# Patient Record
Sex: Female | Born: 1980 | Race: White | Hispanic: No | Marital: Married | Smoking: Never smoker
Health system: Southern US, Community
[De-identification: ages and names within clinical notes are randomized; demographics above are authoritative.]

## PROBLEM LIST (undated history)

## (undated) ENCOUNTER — Inpatient Hospital Stay: Payer: Self-pay

## (undated) DIAGNOSIS — Z973 Presence of spectacles and contact lenses: Secondary | ICD-10-CM

## (undated) DIAGNOSIS — R51 Headache: Secondary | ICD-10-CM

## (undated) DIAGNOSIS — R519 Headache, unspecified: Secondary | ICD-10-CM

## (undated) HISTORY — PX: COSMETIC SURGERY: SHX468

---

## 2014-09-22 ENCOUNTER — Ambulatory Visit
Admit: 2014-09-22 | Disposition: A | Discharge status: Home / Self Care | Payer: Self-pay | Attending: General Practice | Admitting: General Practice

## 2014-10-16 DIAGNOSIS — R519 Headache, unspecified: Secondary | ICD-10-CM | POA: Insufficient documentation

## 2014-10-16 DIAGNOSIS — R51 Headache: Secondary | ICD-10-CM

## 2014-12-27 ENCOUNTER — Telehealth: Payer: Self-pay | Admitting: Gastroenterology

## 2014-12-27 NOTE — Telephone Encounter (Signed)
Colonoscopy triage °

## 2014-12-28 ENCOUNTER — Other Ambulatory Visit: Payer: Self-pay

## 2014-12-28 NOTE — Telephone Encounter (Signed)
Gastroenterology Pre-Procedure Review  Request Date: 02-12-15 Requesting Physician: Dr.   PATIENT REVIEW QUESTIONS: The patient responded to the following health history questions as indicated:    1. Are you having any GI issues? no 2. Do you have a personal history of Polyps? no 3. Do you have a family history of Colon Cancer or Polyps? yes (Sister. Recently dx with colon cancer) 4. Diabetes Mellitus? no 5. Joint replacements in the past 12 months?no 6. Major health problems in the past 3 months?no 7. Any artificial heart valves, MVP, or defibrillator?no    MEDICATIONS & ALLERGIES:    Patient reports the following regarding taking any anticoagulation/antiplatelet therapy:   Plavix, Coumadin, Eliquis, Xarelto, Lovenox, Pradaxa, Brilinta, or Effient? no Aspirin? no  Patient confirms/reports the following medications:  Current Outpatient Prescriptions  Medication Sig Dispense Refill  . valACYclovir (VALTREX) 1000 MG tablet Take 1,000 mg by mouth 2 (two) times daily.    . nortriptyline (PAMELOR) 50 MG capsule TAKE 1 CAPSULE BY MOUTH NIGHTLY.  0   No current facility-administered medications for this visit.    Patient confirms/reports the following allergies:  No Known Allergies  No orders of the defined types were placed in this encounter.    AUTHORIZATION INFORMATION Primary Insurance: 1D#: Group #:  Secondary Insurance: 1D#: Group #:  SCHEDULE INFORMATION: Date: 02-12-15 Time: Location: [facility]

## 2015-02-05 ENCOUNTER — Encounter: Payer: Self-pay | Admitting: *Deleted

## 2015-02-09 NOTE — Discharge Instructions (Signed)

## 2015-02-12 ENCOUNTER — Ambulatory Visit: Payer: PRIVATE HEALTH INSURANCE | Admitting: Anesthesiology

## 2015-02-12 ENCOUNTER — Encounter
Admission: RE | Disposition: A | Discharge status: Home / Self Care | Payer: Self-pay | Source: Ambulatory Visit | Attending: Gastroenterology

## 2015-02-12 ENCOUNTER — Ambulatory Visit
Admission: RE | Admit: 2015-02-12 | Discharge: 2015-02-12 | Disposition: A | Discharge status: Home / Self Care | Payer: PRIVATE HEALTH INSURANCE | Source: Ambulatory Visit | Attending: Gastroenterology | Admitting: Gastroenterology

## 2015-02-12 DIAGNOSIS — Z1211 Encounter for screening for malignant neoplasm of colon: Secondary | ICD-10-CM | POA: Insufficient documentation

## 2015-02-12 DIAGNOSIS — Z79899 Other long term (current) drug therapy: Secondary | ICD-10-CM | POA: Diagnosis not present

## 2015-02-12 DIAGNOSIS — Z800 Family history of malignant neoplasm of digestive organs: Secondary | ICD-10-CM | POA: Diagnosis not present

## 2015-02-12 DIAGNOSIS — Z6838 Body mass index (BMI) 38.0-38.9, adult: Secondary | ICD-10-CM | POA: Insufficient documentation

## 2015-02-12 HISTORY — DX: Headache: R51

## 2015-02-12 HISTORY — PX: COLONOSCOPY WITH PROPOFOL: SHX5780

## 2015-02-12 HISTORY — DX: Presence of spectacles and contact lenses: Z97.3

## 2015-02-12 HISTORY — DX: Headache, unspecified: R51.9

## 2015-02-12 SURGERY — COLONOSCOPY WITH PROPOFOL
Anesthesia: Monitor Anesthesia Care | Wound class: Contaminated

## 2015-02-12 MED ORDER — PROPOFOL 10 MG/ML IV BOLUS
INTRAVENOUS | Status: DC | PRN
  Administered 2015-02-12: 40 mg via INTRAVENOUS
  Administered 2015-02-12: 60 mg via INTRAVENOUS
  Administered 2015-02-12: 40 mg via INTRAVENOUS
  Administered 2015-02-12: 30 mg via INTRAVENOUS
  Administered 2015-02-12: 40 mg via INTRAVENOUS
  Administered 2015-02-12 (×2): 20 mg via INTRAVENOUS

## 2015-02-12 MED ORDER — LACTATED RINGERS IV SOLN
INTRAVENOUS | Status: DC
  Administered 2015-02-12: 10:00:00 via INTRAVENOUS

## 2015-02-12 MED ORDER — STERILE WATER FOR IRRIGATION IR SOLN
Status: DC | PRN
  Administered 2015-02-12: 11:00:00

## 2015-02-12 MED ORDER — LIDOCAINE HCL (CARDIAC) 20 MG/ML IV SOLN
INTRAVENOUS | Status: DC | PRN
  Administered 2015-02-12: 50 mg via INTRAVENOUS

## 2015-02-12 SURGICAL SUPPLY — 28 items

## 2015-02-12 NOTE — Anesthesia Procedure Notes (Signed)
Procedure Name: MAC Performed by: Miyoko Hashimi Pre-anesthesia Checklist: Patient identified, Emergency Drugs available, Suction available, Timeout performed and Patient being monitored Patient Re-evaluated:Patient Re-evaluated prior to inductionOxygen Delivery Method: Nasal cannula Placement Confirmation: positive ETCO2       

## 2015-02-12 NOTE — Anesthesia Preprocedure Evaluation (Signed)
Anesthesia Evaluation  Patient identified by MRN, date of birth, ID band  Reviewed: Allergy & Precautions, H&P , NPO status , Patient's Chart, lab work & pertinent test results  Airway Mallampati: II  TM Distance: >3 FB Neck ROM: full    Dental no notable dental hx.    Pulmonary    Pulmonary exam normal       Cardiovascular Rhythm:regular Rate:Normal     Neuro/Psych  Headaches,    GI/Hepatic   Endo/Other  Morbid obesity  Renal/GU      Musculoskeletal   Abdominal   Peds  Hematology   Anesthesia Other Findings   Reproductive/Obstetrics                             Anesthesia Physical Anesthesia Plan  ASA: II  Anesthesia Plan: MAC   Post-op Pain Management:    Induction:   Airway Management Planned:   Additional Equipment:   Intra-op Plan:   Post-operative Plan:   Informed Consent: I have reviewed the patients History and Physical, chart, labs and discussed the procedure including the risks, benefits and alternatives for the proposed anesthesia with the patient or authorized representative who has indicated his/her understanding and acceptance.     Plan Discussed with: CRNA  Anesthesia Plan Comments:         Anesthesia Quick Evaluation

## 2015-02-12 NOTE — Op Note (Signed)
[facility] Gastroenterology Patient Name: Anna Gaines Procedure Date: 02/12/2015 10:57 AM MRN: [id number] Account #: [id number] Date of Birth: 08/01/1980 Admit Type: Outpatient Age: 34 Room: [facility] OR ROOM 01 Gender: Female Note Status: Finalized Procedure:         Colonoscopy Indications:       Screening for colon cancer: Colorectal cancer in distant                     relative(s) before age 19 Providers:         Midge Minium, MD Referring MD:      Jabier Mutton, MD (Referring MD) Medicines:         Propofol per Anesthesia Complications:     No immediate complications. Procedure:         Pre-Anesthesia Assessment:                    - Prior to the procedure, a History and Physical was                     performed, and patient medications and allergies were                     reviewed. The patient's tolerance of previous anesthesia                     was also reviewed. The risks and benefits of the procedure                     and the sedation options and risks were discussed with the                     patient. All questions were answered, and informed consent                     was obtained. Prior Anticoagulants: The patient has taken                     no previous anticoagulant or antiplatelet agents. ASA                     Grade Assessment: II - A patient with mild systemic                     disease. After reviewing the risks and benefits, the                     patient was deemed in satisfactory condition to undergo                     the procedure.                    After obtaining informed consent, the colonoscope was                     passed under direct vision. Throughout the procedure, the                     patient's blood pressure, pulse, and oxygen saturations                     were monitored continuously. The Olympus CF H180AL  colonoscope (S#: [id number]) was introduced through the anus                     and  advanced to the the cecum, identified by appendiceal                     orifice and ileocecal valve. The colonoscopy was performed                     without difficulty. The patient tolerated the procedure                     well. The quality of the bowel preparation was excellent. Findings:      The perianal and digital rectal examinations were normal.      The colon (entire examined portion) appeared normal. Impression:        - The entire examined colon is normal.                    - No specimens collected. Recommendation:    - Repeat colonoscopy in 5 years for surveillance. Procedure Code(s): --- Professional ---                    [phone], Colonoscopy, flexible; diagnostic, including                     collection of specimen(s) by brushing or washing, when                     performed (separate procedure) Diagnosis Code(s): --- Professional ---                    Z12.11, Encounter for screening for malignant neoplasm of                     colon                    Z80.0, Family history of malignant neoplasm of digestive                     organs CPT copyright 2014 American Medical Association. All rights reserved. The codes documented in this report are preliminary and upon coder review may  be revised to meet current compliance requirements. Midge Minium, MD 02/12/2015 11:23:53 AM This report has been signed electronically. Number of Addenda: 0 Note Initiated On: 02/12/2015 10:57 AM Scope Withdrawal Time: 0 hours 7 minutes 31 seconds  Total Procedure Duration: 0 hours 12 minutes 48 seconds       [facility]

## 2015-02-12 NOTE — Anesthesia Postprocedure Evaluation (Signed)
  Anesthesia Post-op Note  Patient: Anna Gaines  Procedure(s) Performed: Procedure(s): COLONOSCOPY WITH PROPOFOL (N/A)  Anesthesia type:MAC  Patient location: PACU  Post pain: Pain level controlled  Post assessment: Post-op Vital signs reviewed, Patient's Cardiovascular Status Stable, Respiratory Function Stable, Patent Airway and No signs of Nausea or vomiting  Post vital signs: Reviewed and stable  Last Vitals:  Filed Vitals:   02/12/15 1135  BP:   Pulse: 82  Temp:   Resp: 20    Level of consciousness: awake, alert  and patient cooperative  Complications: No apparent anesthesia complications
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
## 2015-02-12 NOTE — Transfer of Care (Signed)
Immediate Anesthesia Transfer of Care Note  Patient: Anna Gaines  Procedure(s) Performed: Procedure(s): COLONOSCOPY WITH PROPOFOL (N/A)  Patient Location: PACU  Anesthesia Type: MAC  Level of Consciousness: awake, alert  and patient cooperative  Airway and Oxygen Therapy: Patient Spontanous Breathing and Patient connected to supplemental oxygen  Post-op Assessment: Post-op Vital signs reviewed, Patient's Cardiovascular Status Stable, Respiratory Function Stable, Patent Airway and No signs of Nausea or vomiting  Post-op Vital Signs: Reviewed and stable  Complications: No apparent anesthesia complications

## 2015-02-13 ENCOUNTER — Encounter: Payer: Self-pay | Admitting: Gastroenterology

## 2015-04-02 ENCOUNTER — Ambulatory Visit: Payer: Self-pay | Admitting: Physician Assistant

## 2015-04-02 ENCOUNTER — Other Ambulatory Visit: Payer: Self-pay | Admitting: Physician Assistant

## 2015-04-02 VITALS — BP 120/80 | HR 116 | Temp 98.5°F

## 2015-04-02 DIAGNOSIS — J0120 Acute ethmoidal sinusitis, unspecified: Secondary | ICD-10-CM

## 2015-04-02 DIAGNOSIS — H1089 Other conjunctivitis: Secondary | ICD-10-CM

## 2015-04-02 MED ORDER — GENTAMICIN SULFATE 0.3 % OP SOLN: 1.0000 [drp] | Freq: Four times a day (QID) | OPHTHALMIC | Status: DC

## 2015-04-02 MED ORDER — NAPHAZOLINE HCL 0.1 % OP SOLN: 1.0000 [drp] | Freq: Four times a day (QID) | OPHTHALMIC | Status: DC | PRN

## 2015-04-02 NOTE — Progress Notes (Signed)
  Red Eyes Subjective:    Patient ID: Anna Gaines, female    DOB: 02/20/1981, 34 y.o.   MRN: [id number]  HPI Patient c/o redness, wateryeyes, and matted eyelid for 2 days.  Patient wear contact lens. Last usage was 3 days ago.  Denies vision loss. Wearing eyeglass at this time.    Review of Systems Denies URI compliant.     Objective:   Physical Exam PERLA/EOM intact.  Right erythematous conjunctiva, matted left eye lid.       Assessment & Plan:  Conjunctivitis. Gentex and Naphcon eye drops as directed.  Follow up it condition worsen.

## 2015-04-19 DIAGNOSIS — G44221 Chronic tension-type headache, intractable: Secondary | ICD-10-CM | POA: Insufficient documentation

## 2015-05-25 ENCOUNTER — Ambulatory Visit: Payer: Self-pay | Admitting: Physician Assistant

## 2015-05-25 ENCOUNTER — Encounter: Payer: Self-pay | Admitting: Physician Assistant

## 2015-05-25 VITALS — BP 119/70 | HR 95 | Temp 98.6°F

## 2015-05-25 DIAGNOSIS — J029 Acute pharyngitis, unspecified: Secondary | ICD-10-CM

## 2015-05-25 DIAGNOSIS — B001 Herpesviral vesicular dermatitis: Secondary | ICD-10-CM

## 2015-05-25 MED ORDER — VALACYCLOVIR HCL 1 G PO TABS: 1000.0000 mg | ORAL_TABLET | Freq: Two times a day (BID) | ORAL | Status: DC

## 2015-05-25 MED ORDER — AZITHROMYCIN 250 MG PO TABS: ORAL_TABLET | ORAL | Status: DC

## 2015-05-25 MED ORDER — FLUCONAZOLE 150 MG PO TABS: 150.0000 mg | ORAL_TABLET | Freq: Once | ORAL | Status: DC

## 2015-05-25 NOTE — Progress Notes (Signed)
S:  C/o sore throat , sx for 1 day, no exposure to strep, no fever/chills, no cp/sob, no runny nose congestion, also needs refill for valtrex  O: vitals wnl, nad, tms clear, nasal mucosa red a little swollen, throat injected with cobblestoning from pnd, neck supple no lymph, lungs c t a, cv rrr  A: acute pharyngitis secondary to pnd, hx of herpes labialis  P: zpack, diflucan if sore throat worsening over the weekend, valtrex med refill

## 2015-06-04 ENCOUNTER — Encounter: Payer: Self-pay | Admitting: Physician Assistant

## 2015-06-04 ENCOUNTER — Ambulatory Visit: Payer: Self-pay | Admitting: Physician Assistant

## 2015-06-04 VITALS — BP 119/70 | HR 100 | Temp 98.5°F

## 2015-06-04 DIAGNOSIS — J069 Acute upper respiratory infection, unspecified: Secondary | ICD-10-CM

## 2015-06-04 DIAGNOSIS — J029 Acute pharyngitis, unspecified: Secondary | ICD-10-CM

## 2015-06-04 MED ORDER — HYDROCOD POLST-CPM POLST ER 10-8 MG/5ML PO SUER: 5.0000 mL | Freq: Two times a day (BID) | ORAL | Status: DC | PRN

## 2015-06-04 MED ORDER — FLUTICASONE PROPIONATE 50 MCG/ACT NA SUSP: 2.0000 | Freq: Every day | NASAL | Status: DC

## 2015-06-04 MED ORDER — CEFDINIR 300 MG PO CAPS: 300.0000 mg | ORAL_CAPSULE | Freq: Two times a day (BID) | ORAL | Status: DC

## 2015-06-04 MED ORDER — FLUCONAZOLE 150 MG PO TABS: ORAL_TABLET | ORAL | Status: DC

## 2015-06-04 NOTE — Progress Notes (Signed)
S: continued cough and congestion, finished zpack more than 5 days ago, still coughing up green mucus, no known fever/chills, family member is having chemo and is concerned about giving her the illness  O: vitals wnl, nad, tms dull, nasal mucosa red and swollen, throat wnl, neck supple no lymph, lungs c t a, cv rrr  A: acute upper respiratory  P: omnicef , flonase, diflucan, tussionex nr

## 2015-06-13 ENCOUNTER — Ambulatory Visit: Payer: Self-pay | Admitting: Physician Assistant

## 2015-06-13 ENCOUNTER — Encounter: Payer: Self-pay | Admitting: Physician Assistant

## 2015-06-13 VITALS — BP 110/70 | HR 91 | Temp 98.3°F

## 2015-06-13 DIAGNOSIS — L309 Dermatitis, unspecified: Secondary | ICD-10-CM

## 2015-06-13 NOTE — Progress Notes (Signed)
S: c/o rash on fingers, at edges around nails and a couple on palms, no drainage or pustules, is itchy but not bad, no otc meds  O: vitals wnl, nad, skin with small raised bumps like eczema at fingers, no burrows noted, no blisters noted, no drainage, n/v intact  A: eczema  P: otc hydrocortisone cream, lotion

## 2015-06-17 NOTE — L&D Delivery Note (Addendum)
Delivery Summary for Drusilla Kannerina Lottman  Labor Events:   Preterm labor:   Rupture date:   Rupture time:   Rupture type: Intact  Fluid Color: Pink  Induction:   Augmentation:   Complications:   Cervical ripening:          Delivery:   Episiotomy:   Lacerations:   Repair suture:   Repair # of packets:   Blood loss (ml): 400    Information for the patient's newborn:  Azell DerWheeler, Girl Berlene [[id number]][[id number]]    Delivery 05/21/2016 11:36 AM by  C-Section, Low Transverse Sex:  female Gestational Age: 8155w3d Delivery Clinician:   Living?:         APGARS  One minute Five minutes Ten minutes  Skin color:        Heart rate:        Grimace:        Muscle tone:        Breathing:        Totals: 8  9      Presentation/position:      Resuscitation:   Cord information:    Disposition of cord blood:     Blood gases sent?  Complications:   Placenta: Delivered:       appearance Newborn Measurements: Weight: 6 lb 0.3 oz (2730 g)  Height: 18.5"  Head circumference:    Chest circumference:    Other providers:    Additional  information: Forceps:   Vacuum:   Breech:   Observed anomalies        See Dr. Oretha Milchherry's operative note for details of C-section procedure.    Hildred LaserAnika Delorese Sellin, MD [facility]

## 2015-07-09 ENCOUNTER — Ambulatory Visit: Payer: Self-pay | Admitting: Physician Assistant

## 2015-07-09 ENCOUNTER — Encounter: Payer: Self-pay | Admitting: Physician Assistant

## 2015-07-09 VITALS — BP 119/80 | HR 94 | Temp 97.8°F

## 2015-07-09 DIAGNOSIS — R112 Nausea with vomiting, unspecified: Secondary | ICD-10-CM

## 2015-07-09 LAB — POCT URINALYSIS DIPSTICK
BILIRUBIN UA: NEGATIVE
GLUCOSE UA: NEGATIVE
KETONES UA: NEGATIVE
LEUKOCYTES UA: NEGATIVE
Nitrite, UA: NEGATIVE
PH UA: 6
Protein, UA: NEGATIVE
Spec Grav, UA: 1.025
Urobilinogen, UA: 0.2

## 2015-07-09 LAB — POCT URINE PREGNANCY: Preg Test, Ur: NEGATIVE

## 2015-07-09 MED ORDER — PROMETHAZINE HCL 25 MG PO TABS: 25.0000 mg | ORAL_TABLET | Freq: Three times a day (TID) | ORAL | Status: DC | PRN

## 2015-07-09 NOTE — Progress Notes (Signed)
S: lmp was on Jan 10, having some nausea/vomiting all day long, breasts are tender, no fever/chills, no diarrhea, ?if pregnant  O; vitals wnl, lungs c t a, cv rrr, abd soft nontender bs normal, ua wnl, urine preg neg  A: nausea/vomiting  P: phenergan  tid prn n/v, explained to pt that she might be to early in the pregnancy for results to show positive, return for further testing if sx continue

## 2015-07-25 ENCOUNTER — Encounter: Payer: Self-pay | Admitting: Physician Assistant

## 2015-07-25 ENCOUNTER — Ambulatory Visit: Payer: Self-pay | Admitting: Physician Assistant

## 2015-07-25 VITALS — BP 110/70 | HR 97 | Temp 98.3°F

## 2015-07-25 DIAGNOSIS — D510 Vitamin B12 deficiency anemia due to intrinsic factor deficiency: Secondary | ICD-10-CM

## 2015-07-25 DIAGNOSIS — M549 Dorsalgia, unspecified: Secondary | ICD-10-CM

## 2015-07-25 DIAGNOSIS — R5383 Other fatigue: Secondary | ICD-10-CM

## 2015-07-25 MED ORDER — CYCLOBENZAPRINE HCL 10 MG PO TABS: 10.0000 mg | ORAL_TABLET | Freq: Three times a day (TID) | ORAL | Status: DC | PRN

## 2015-07-25 MED ORDER — METHYLPREDNISOLONE 4 MG PO TBPK: ORAL_TABLET | ORAL | Status: DC

## 2015-07-25 MED ORDER — HYDROCODONE-ACETAMINOPHEN 5-325 MG PO TABS: 1.0000 | ORAL_TABLET | Freq: Four times a day (QID) | ORAL | Status: DC | PRN

## 2015-07-25 NOTE — Progress Notes (Signed)
S: c/o fatigue, no energy, had a light period like spotting,  low back pain with radiation to left leg all the way to her foot, went to chiropractor yesterday and he "popped her back", no known injury, no fever/chills, no cp/sob  O: vitals wnl, nad, lungs c t a, cv rrr, spine nontender, si joint tender and muscles spasmed on left, + slr on left side, n/v intact, pt appears pale and tired  A: low back pain, fatigue  P: labs exec panel, b12, vit d, ebv, beta hcg, rx for medrol and flexeril given, pt is not to fill until results of beta hcg are back, explained to her these medications would be dangerous for a fetus; rx vicodin [id number] #20 nr

## 2015-07-26 LAB — CMP12+LP+TP+TSH+6AC+CBC/D/PLT
A/G RATIO: 1.9 (ref 1.1–2.5)
ALK PHOS: 103 IU/L (ref 39–117)
ALT: 16 IU/L (ref 0–32)
AST: 16 IU/L (ref 0–40)
Albumin: 4.2 g/dL (ref 3.5–5.5)
BASOS ABS: 0 10*3/uL (ref 0.0–0.2)
BILIRUBIN TOTAL: 0.4 mg/dL (ref 0.0–1.2)
BUN/Creatinine Ratio: 18 (ref 8–20)
BUN: 14 mg/dL (ref 6–20)
Basos: 0 %
CHOL/HDL RATIO: 3.6 ratio (ref 0.0–4.4)
Calcium: 9.2 mg/dL (ref 8.7–10.2)
Chloride: 105 mmol/L (ref 96–106)
Cholesterol, Total: 211 mg/dL — ABNORMAL HIGH (ref 100–199)
Creatinine, Ser: 0.8 mg/dL (ref 0.57–1.00)
EOS (ABSOLUTE): 0.1 10*3/uL (ref 0.0–0.4)
EOS: 2 %
Estimated CHD Risk: 0.7 times avg. (ref 0.0–1.0)
Free Thyroxine Index: 2.5 (ref 1.2–4.9)
GFR calc non Af Amer: 96 mL/min/{1.73_m2} (ref >59)
GFR, EST AFRICAN AMERICAN: 110 mL/min/{1.73_m2} (ref >59)
GGT: 17 IU/L (ref 0–60)
GLOBULIN, TOTAL: 2.2 g/dL (ref 1.5–4.5)
GLUCOSE: 100 mg/dL — AB (ref 65–99)
HDL: 58 mg/dL (ref >39)
HEMOGLOBIN: 13 g/dL (ref 11.1–15.9)
Hematocrit: 38.7 % (ref 34.0–46.6)
IMMATURE GRANS (ABS): 0 10*3/uL (ref 0.0–0.1)
Immature Granulocytes: 0 %
Iron: 78 ug/dL (ref 27–159)
LDH: 139 IU/L (ref 119–226)
LDL Calculated: 140 mg/dL — ABNORMAL HIGH (ref 0–99)
LYMPHS: 41 %
Lymphocytes Absolute: 1.7 10*3/uL (ref 0.7–3.1)
MCH: 29.6 pg (ref 26.6–33.0)
MCHC: 33.6 g/dL (ref 31.5–35.7)
MCV: 88 fL (ref 79–97)
MONOCYTES: 8 %
Monocytes Absolute: 0.3 10*3/uL (ref 0.1–0.9)
Neutrophils Absolute: 2 10*3/uL (ref 1.4–7.0)
Neutrophils: 49 %
PHOSPHORUS: 3.2 mg/dL (ref 2.5–4.5)
PLATELETS: 263 10*3/uL (ref 150–379)
Potassium: 4.5 mmol/L (ref 3.5–5.2)
RBC: 4.39 x10E6/uL (ref 3.77–5.28)
RDW: 13.9 % (ref 12.3–15.4)
Sodium: 142 mmol/L (ref 134–144)
T3 Uptake Ratio: 24 % (ref 24–39)
T4 TOTAL: 10.4 ug/dL (ref 4.5–12.0)
TRIGLYCERIDES: 67 mg/dL (ref 0–149)
TSH: 1.17 u[IU]/mL (ref 0.450–4.500)
Total Protein: 6.4 g/dL (ref 6.0–8.5)
URIC ACID: 5.5 mg/dL (ref 2.5–7.1)
VLDL CHOLESTEROL CAL: 13 mg/dL (ref 5–40)
WBC: 4.1 10*3/uL (ref 3.4–10.8)

## 2015-07-26 LAB — EPSTEIN-BARR VIRUS VCA ANTIBODY PANEL: EBV VCA IGG: 30.2 U/mL — AB (ref 0.0–17.9)

## 2015-07-26 LAB — B12 AND FOLATE PANEL
Folate: 12.6 ng/mL (ref >3.0)
Vitamin B-12: 156 pg/mL — ABNORMAL LOW (ref 211–946)

## 2015-07-26 LAB — HCG, SERUM, QUALITATIVE: HCG, BETA SUBUNIT, QUAL, SERUM: NEGATIVE m[IU]/mL (ref <6)

## 2015-07-26 LAB — VITAMIN D 25 HYDROXY (VIT D DEFICIENCY, FRACTURES): Vit D, 25-Hydroxy: 47.9 ng/mL (ref 30.0–100.0)

## 2015-07-26 MED ORDER — CYANOCOBALAMIN 1000 MCG/ML IJ SOLN: 1000.0000 ug | Freq: Once | INTRAMUSCULAR | Status: DC

## 2015-07-26 NOTE — Addendum Note (Signed)
Addended by: Faythe Ghee on: 07/26/2015 05:00 PM   Modules accepted: Orders

## 2015-07-26 NOTE — Progress Notes (Signed)
Lab review shows neg preg test, normal vit, neg ebv, but low b12 ; called pt to discuss, rx'd b12 injections 1x q month, pt will pick up medication and bring to clinic for injections

## 2015-09-19 ENCOUNTER — Other Ambulatory Visit: Payer: Self-pay

## 2015-09-19 DIAGNOSIS — O0001 Abdominal pregnancy with intrauterine pregnancy: Secondary | ICD-10-CM

## 2015-09-19 NOTE — Progress Notes (Signed)
[facility] came [facility]  Blood was drawn from the right arm without any incident.

## 2015-09-20 LAB — HCG, BETA SUBUNIT, QN (SERIAL): HCG, Beta Chain, Quant, S: 1579 m[IU]/mL

## 2015-09-20 NOTE — Progress Notes (Signed)
I spoke with the patient about her lab results and she expressed understanding. 

## 2015-10-04 ENCOUNTER — Ambulatory Visit (INDEPENDENT_AMBULATORY_CARE_PROVIDER_SITE_OTHER): Payer: Managed Care, Other (non HMO) | Admitting: Obstetrics and Gynecology

## 2015-10-04 VITALS — BP 114/71 | HR 93 | Ht 64.0 in | Wt 225.0 lb

## 2015-10-04 DIAGNOSIS — Z36 Encounter for antenatal screening of mother: Secondary | ICD-10-CM

## 2015-10-04 DIAGNOSIS — Z3490 Encounter for supervision of normal pregnancy, unspecified, unspecified trimester: Secondary | ICD-10-CM

## 2015-10-04 DIAGNOSIS — Z3687 Encounter for antenatal screening for uncertain dates: Secondary | ICD-10-CM

## 2015-10-04 DIAGNOSIS — Z369 Encounter for antenatal screening, unspecified: Secondary | ICD-10-CM

## 2015-10-04 DIAGNOSIS — Z113 Encounter for screening for infections with a predominantly sexual mode of transmission: Secondary | ICD-10-CM

## 2015-10-04 DIAGNOSIS — Z1389 Encounter for screening for other disorder: Secondary | ICD-10-CM

## 2015-10-04 DIAGNOSIS — Z331 Pregnant state, incidental: Secondary | ICD-10-CM

## 2015-10-04 DIAGNOSIS — R638 Other symptoms and signs concerning food and fluid intake: Secondary | ICD-10-CM

## 2015-10-04 DIAGNOSIS — N926 Irregular menstruation, unspecified: Secondary | ICD-10-CM

## 2015-10-04 NOTE — Patient Instructions (Signed)
Chlamydia, Female Chlamydia is an infection. It is spread from one person to another person during sexual contact. This infection can be in the cervix, urine tube (urethra), throat, or bottom (rectum). This infection needs treatment. HOME CARE   Take your medicines (antibiotics) as told. Finish them even if you start to feel better.  Only take medicine as told by your doctor.  Tell your sex partner(s) that you have chlamydia. They must also be treated.  Do not have sex until your doctor says it is okay.  Rest.  Eat healthy. Drink enough fluids to keep your pee (urine) clear or pale yellow.  Keep all doctor visits as told. GET HELP IF:  You have pain when you pee.  You have belly pain.  You have vaginal discharge.  You have pain during sex.  You have bleeding between periods and after sex.  You have a fever. GET HELP RIGHT AWAY IF:   You feel sick to your stomach (nauseous) or you throw up (vomit).  You sweat much more than normal (diaphoresis).  You have trouble swallowing.   This information is not intended to replace advice given to you by your health care provider. Make sure you discuss any questions you have with your health care provider.   Document Released: 03/11/2008 Document Revised: 02/21/2015 Document Reviewed: 02/07/2013 Elsevier Interactive Patient Education 2016 [employer]. Pregnancy and Zika Virus Disease Zika virus disease, or Zika, is an illness that can spread to people from mosquitoes that carry the virus. It may also spread from person to person through infected body fluids. Zika first occurred in [place], but recently it has spread to new areas. The virus occurs in tropical climates. The location of Zika continues to change. Most people who become infected with Zika virus do not develop serious illness. However, Zika may cause birth defects in an unborn baby whose mother is infected with the virus. It may also increase the risk of miscarriage. WHAT  ARE THE SYMPTOMS OF ZIKA VIRUS DISEASE? In many cases, people who have been infected with Zika virus do not develop any symptoms. If symptoms appear, they usually start about a week after the person is infected. Symptoms are usually mild. They may include:  Fever.  Rash.  Red eyes.  Joint pain. HOW DOES ZIKA VIRUS DISEASE SPREAD? The main way that Zika virus spreads is through the bite of a certain type of mosquito. Unlike most types of mosquitos, which bite only at night, the type of mosquito that carries Zika virus bites both at night and during the day. Zika virus can also spread through sexual contact, through a blood transfusion, and from a mother to her baby before or during birth. Once you have had Zika virus disease, it is unlikely that you will get it again. CAN I PASS ZIKA TO MY BABY DURING PREGNANCY? Yes, Zika can pass from a mother to her baby before or during birth. WHAT PROBLEMS CAN ZIKA CAUSE FOR MY BABY? A woman who is infected with Zika virus while pregnant is at risk of having her baby born with a condition in which the brain or head is smaller than expected (microcephaly). Babies who have microcephaly can have developmental delays, seizures, hearing problems, and vision problems. Having Zika virus disease during pregnancy can also increase the risk of miscarriage. HOW CAN ZIKA VIRUS DISEASE BE PREVENTED? There is no vaccine to prevent Zika. The best way to prevent the disease is to avoid infected mosquitoes and avoid exposure to body  fluids that can spread the virus. Avoid any possible exposure to Zika by taking the following precautions. For women and their sex partners:  Avoid traveling to high-risk areas. The locations where [place] is being reported change often. To identify high-risk areas, check the CDC travel website: http://davidson-gomez.com/  If you or your sex partner must travel to a high-risk area, talk with a health care provider before and after  traveling.  Take all precautions to avoid mosquito bites if you live in, or travel to, any of the high-risk areas. Insect repellents are safe to use during pregnancy.  Ask your health care provider when it is safe to have sexual contact. For women:  If you are pregnant or trying to become pregnant, avoid sexual contact with persons who may have been exposed to [place] virus, persons who have possible symptoms of Zika, or persons whose history you are unsure about. If you choose to have sexual contact with someone who may have been exposed to [place] virus, use condoms correctly during the entire duration of sexual activity, every time. Do not share sexual devices, as you may be exposed to body fluids.  Ask your health care provider about when it is safe to attempt pregnancy after a possible exposure to Zika virus. WHAT STEPS SHOULD I TAKE TO AVOID MOSQUITO BITES? Take these steps to avoid mosquito bites when you are in a high-risk area:  Wear loose clothing that covers your arms and legs.  Limit your outdoor activities.  Do not open windows unless they have window screens.  Sleep under mosquito nets.  Use insect repellent. The best insect repellents have:  DEET, picaridin, oil of lemon eucalyptus (OLE), or IR3535 in them.  Higher amounts of an active ingredient in them.  Remember that insect repellents are safe to use during pregnancy.  Do not use OLE on children who are younger than 100 years of age. Do not use insect repellent on babies who are younger than 8 months of age.  Cover your child's stroller with mosquito netting. Make sure the netting fits snugly and that any loose netting does not cover your child's mouth or nose. Do not use a blanket as a mosquito-protection cover.  Do not apply insect repellent underneath clothing.  If you are using sunscreen, apply the sunscreen before applying the insect repellent.  Treat clothing with permethrin. Do not apply permethrin directly to  your skin. Follow label directions for safe use.  Get rid of standing water, where mosquitoes may reproduce. Standing water is often found in items such as buckets, bowls, animal food dishes, and flowerpots. When you return from traveling to any high-risk area, continue taking actions to protect yourself against mosquito bites for 3 weeks, even if you show no signs of illness. This will prevent spreading Zika virus to uninfected mosquitoes. WHAT SHOULD I KNOW ABOUT THE SEXUAL TRANSMISSION OF ZIKA? People can spread Zika to their sexual partners during vaginal, anal, or oral sex, or by sharing sexual devices. Many people with [place] do not develop symptoms, so a person could spread the disease without knowing that they are infected. The greatest risk is to women who are pregnant or who may become pregnant. Zika virus can live longer in semen than it can live in blood. Couples can prevent sexual transmission of the virus by:  Using condoms correctly during the entire duration of sexual activity, every time. This includes vaginal, anal, and oral sex.  Not sharing sexual devices. Sharing increases your risk of being  exposed to body fluid from another person.  Avoiding all sexual activity until your health care provider says it is safe. SHOULD I BE TESTED FOR ZIKA VIRUS? A sample of your blood can be tested for Zika virus. A pregnant woman should be tested if she may have been exposed to the virus or if she has symptoms of Zika. She may also have additional tests done during her pregnancy, such ultrasound testing. Talk with your health care provider about which tests are recommended.   This information is not intended to replace advice given to you by your health care provider. Make sure you discuss any questions you have with your health care provider.   Document Released: 02/21/2015 Document Reviewed: 02/14/2015 Elsevier Interactive Patient Education 2016 [employer]. Minor Illnesses and Medications  in Pregnancy  Cold/Flu:  Sudafed for congestion- Robitussin (plain) for cough- Tylenol for discomfort.  Please follow the directions on the label.  Try not to take any more than needed.  OTC Saline nasal spray and air humidifier or cool-mist  Vaporizer to sooth nasal irritation and to loosen congestion.  It is also important to increase intake of non carbonated fluids, especially if you have a fever.  Constipation:  Colace-2 capsules at bedtime; Metamucil- follow directions on label; Senokot- 1 tablet at bedtime.  Any one of these medications can be used.  It is also very important to increase fluids and fruits along with regular exercise.  If problem persists please call the office.  Diarrhea:  Kaopectate as directed on the label.  Eat a bland diet and increase fluids.  Avoid highly seasoned foods.  Headache:  Tylenol 1 or 2 tablets every 3-4 hours as needed  Indigestion:  Maalox, Mylanta, Tums or Rolaids- as directed on label.  Also try to eat small meals and avoid fatty, greasy or spicy foods.  Nausea with or without Vomiting:  Nausea in pregnancy is caused by increased levels of hormones in the body which influence the digestive system and cause irritation when stomach acids accumulate.  Symptoms usually subside after 1st trimester of pregnancy.  Try the following:  Keep saltines, graham crackers or dry toast by your bed to eat upon awakening.  Don't let your stomach get empty.  Try to eat 5-6 small meals per day instead of 3 large ones.  Avoid greasy fatty or highly seasoned foods.   Take OTC Unisom 1 tablet at bed time along with OTC Vitamin B6 25-50 mg 3 times per day.    If nausea continues with vomiting and you are unable to keep down food and fluids you may need a prescription medication.  Please notify your provider.   Sore throat:  Chloraseptic spray, throat lozenges and or plain Tylenol.  Vaginal Yeast Infection:  OTC Monistat for 7 days as directed on label.  If symptoms do  not resolve within a week notify provider.  If any of the above problems do not subside with recommended treatment please call the office for further assistance.   Do not take Aspirin, Advil, Motrin or Ibuprofen.  * * OTC= Over the counter Hyperemesis Gravidarum Hyperemesis gravidarum is a severe form of nausea and vomiting that happens during pregnancy. Hyperemesis is worse than morning sickness. It may cause you to have nausea or vomiting all day for many days. It may keep you from eating and drinking enough food and liquids. Hyperemesis usually occurs during the first half (the first 20 weeks) of pregnancy. It often goes away once a woman is  in her second half of pregnancy. However, sometimes hyperemesis continues through an entire pregnancy.  CAUSES  The cause of this condition is not completely known but is thought to be related to changes in the body's hormones when pregnant. It could be from the high level of the pregnancy hormone or an increase in estrogen in the body.  SIGNS AND SYMPTOMS   Severe nausea and vomiting.  Nausea that does not go away.  Vomiting that does not allow you to keep any food down.  Weight loss and body fluid loss (dehydration).  Having no desire to eat or not liking food you have previously enjoyed. DIAGNOSIS  Your health care provider will do a physical exam and ask you about your symptoms. He or she may also order blood tests and urine tests to make sure something else is not causing the problem.  TREATMENT  You may only need medicine to control the problem. If medicines do not control the nausea and vomiting, you will be treated in the hospital to prevent dehydration, increased acid in the blood (acidosis), weight loss, and changes in the electrolytes in your body that may harm the unborn baby (fetus). You may need IV fluids.  HOME CARE INSTRUCTIONS   Only take over-the-counter or prescription medicines as directed by your health care provider.  Try eating  a couple of dry crackers or toast in the morning before getting out of bed.  Avoid foods and smells that upset your stomach.  Avoid fatty and spicy foods.  Eat 5-6 small meals a day.  Do not drink when eating meals. Drink between meals.  For snacks, eat high-protein foods, such as cheese.  Eat or suck on things that have ginger in them. Ginger helps nausea.  Avoid food preparation. The smell of food can spoil your appetite.  Avoid iron pills and iron in your multivitamins until after 3-4 months of being pregnant. However, consult with your health care provider before stopping any prescribed iron pills. SEEK MEDICAL CARE IF:   Your abdominal pain increases.  You have a severe headache.  You have vision problems.  You are losing weight. SEEK IMMEDIATE MEDICAL CARE IF:   You are unable to keep fluids down.  You vomit blood.  You have constant nausea and vomiting.  You have excessive weakness.  You have extreme thirst.  You have dizziness or fainting.  You have a fever or persistent symptoms for more than 2-3 days.  You have a fever and your symptoms suddenly get worse. MAKE SURE YOU:   Understand these instructions.  Will watch your condition.  Will get help right away if you are not doing well or get worse.   This information is not intended to replace advice given to you by your health care provider. Make sure you discuss any questions you have with your health care provider.   Document Released: 06/02/2005 Document Revised: 03/23/2013 Document Reviewed: 01/12/2013 Elsevier Interactive Patient Education 2016 [employer] Commonly Asked Questions During Pregnancy  Cats: A parasite can be excreted in cat feces.  To avoid exposure you need to have another person empty the little box.  If you must empty the litter box you will need to wear gloves.  Wash your hands after handling your cat.  This parasite can also be found in raw or undercooked meat so this should  also be avoided.  Colds, Sore Throats, Flu: Please check your medication sheet to see what you can take for symptoms.  If your  symptoms are unrelieved by these medications please call the office.  Dental Work: Most any dental work Agricultural consultant recommends is permitted.  X-rays should only be taken during the first trimester if absolutely necessary.  Your abdomen should be shielded with a lead apron during all x-rays.  Please notify your provider prior to receiving any x-rays.  Novocaine is fine; gas is not recommended.  If your dentist requires a note from [place] prior to dental work please call the office and we will provide one for you.  Exercise: Exercise is an important part of staying healthy during your pregnancy.  You may continue most exercises you were accustomed to prior to pregnancy.  Later in your pregnancy you will most likely notice you have difficulty with activities requiring balance like riding a bicycle.  It is important that you listen to your body and avoid activities that put you at a higher risk of falling.  Adequate rest and staying well hydrated are a must!  If you have questions about the safety of specific activities ask your provider.    Exposure to Children with illness: Try to avoid obvious exposure; report any symptoms to [place] when noted,  If you have chicken pos, red measles or mumps, you should be immune to these diseases.   Please do not take any vaccines while pregnant unless you have checked with your OB provider.  Fetal Movement: After 28 weeks we recommend you do "kick counts" twice daily.  Lie or sit down in a calm quiet environment and count your baby movements "kicks".  You should feel your baby at least 10 times per hour.  If you have not felt 10 kicks within the first hour get up, walk around and have something sweet to eat or drink then repeat for an additional hour.  If count remains less than 10 per hour notify your provider.  Fumigating: Follow your pest control  agent's advice as to how long to stay out of your home.  Ventilate the area well before re-entering.  Hemorrhoids:   Most over-the-counter preparations can be used during pregnancy.  Check your medication to see what is safe to use.  It is important to use a stool softener or fiber in your diet and to drink lots of liquids.  If hemorrhoids seem to be getting worse please call the office.   Hot Tubs:  Hot tubs Jacuzzis and saunas are not recommended while pregnant.  These increase your internal body temperature and should be avoided.  Intercourse:  Sexual intercourse is safe during pregnancy as long as you are comfortable, unless otherwise advised by your provider.  Spotting may occur after intercourse; report any bright red bleeding that is heavier than spotting.  Labor:  If you know that you are in labor, please go to the hospital.  If you are unsure, please call the office and let us help you decide what to do.  Lifting, straining, etc:  If your job requires heavy lifting or straining please check with your provider for any limitations.  Generally, you should not lift items heavier than that you can lift simply with your hands and arms (no back muscles)  Painting:  Paint fumes do not harm your pregnancy, but may make you ill and should be avoided if possible.  Latex or water based paints have less odor than oils.  Use adequate ventilation while painting.  Permanents & Hair Color:  Chemicals in hair dyes are not recommended as they cause increase hair dryness  which can increase hair loss during pregnancy.  " Highlighting" and permanents are allowed.  Dye may be absorbed differently and permanents may not hold as well during pregnancy.  Sunbathing:  Use a sunscreen, as skin burns easily during pregnancy.  Drink plenty of fluids; avoid over heating.  Tanning Beds:  Because their possible side effects are still unknown, tanning beds are not recommended.  Ultrasound Scans:  Routine ultrasounds are  performed at approximately 20 weeks.  You will be able to see your baby's general anatomy an if you would like to know the gender this can usually be determined as well.  If it is questionable when you conceived you may also receive an ultrasound early in your pregnancy for dating purposes.  Otherwise ultrasound exams are not routinely performed unless there is a medical necessity.  Although you can request a scan we ask that you pay for it when conducted because insurance does not cover " patient request" scans.  Work: If your pregnancy proceeds without complications you may work until your due date, unless your physician or employer advises otherwise.  Round Ligament Pain/Pelvic Discomfort:  Sharp, shooting pains not associated with bleeding are fairly common, usually occurring in the second trimester of pregnancy.  They tend to be worse when standing up or when you remain standing for long periods of time.  These are the result of pressure of certain pelvic ligaments called "round ligaments".  Rest, Tylenol and heat seem to be the most effective relief.  As the womb and fetus grow, they rise out of the pelvis and the discomfort improves.  Please notify the office if your pain seems different than that described.  It may represent a more serious condition.

## 2015-10-04 NOTE — Progress Notes (Signed)
Anna Gaines presents for NOB nurse interview visit. G-2. P-1001. [id number] by Greig Right[facility]an Fisher, [facility]. Pt is unsure of lmp (08/14/15) and hx of irregular menses. Will need ultrasound for dating and viability. Pregnancy education material explained and given. No cats in the home. NOB labs ordered. TSH/HbgA1c due to Increased BMI, HIV labs and Drug screen were explained optional and she could opt out of tests but did not decline. Drug screen ordered. PNV encouraged. NT to discuss with provider. Pt. To follow up with provider in 4 weeks for NOB physical.  All questions answered.    ZIKA EXPOSURE SCREEN:  The patient has not traveled to a [place] Virus endemic area within the past 6 months, nor has she had unprotected sex with a partner who has travelled to a [place] endemic region within the past 6 months. The patient has been advised to notify us if these factors change any time during this current pregnancy, so adequate testing and monitoring can be initiated.

## 2015-10-06 LAB — PAIN MGT SCRN (14 DRUGS), UR
AMPHETAMINE SCRN UR: NEGATIVE ng/mL
BENZODIAZEPINE SCREEN, URINE: NEGATIVE ng/mL
Barbiturate Screen, Ur: NEGATIVE ng/mL
Buprenorphine, Urine: NEGATIVE ng/mL
CANNABINOIDS UR QL SCN: NEGATIVE ng/mL
Cocaine(Metab.)Screen, Urine: NEGATIVE ng/mL
Creatinine(Crt), U: 194.3 mg/dL (ref 20.0–300.0)
FENTANYL, URINE: NEGATIVE pg/mL
Meperidine Screen, Urine: NEGATIVE ng/mL
Methadone Scn, Ur: NEGATIVE ng/mL
OXYCODONE+OXYMORPHONE UR QL SCN: NEGATIVE ng/mL
Opiate Scrn, Ur: NEGATIVE ng/mL
PCP Scrn, Ur: NEGATIVE ng/mL
PH UR, DRUG SCRN: 5.8 (ref 4.5–8.9)
Propoxyphene, Screen: NEGATIVE ng/mL
Tramadol Ur Ql Scn: NEGATIVE ng/mL

## 2015-10-06 LAB — MICROSCOPIC EXAMINATION
Casts: NONE SEEN /lpf
WBC UA: NONE SEEN /HPF (ref 0–?)

## 2015-10-06 LAB — NICOTINE SCREEN, URINE: COTININE UR QL SCN: NEGATIVE ng/mL

## 2015-10-06 LAB — URINALYSIS, ROUTINE W REFLEX MICROSCOPIC
Bilirubin, UA: NEGATIVE
GLUCOSE, UA: NEGATIVE
Ketones, UA: NEGATIVE
Leukocytes, UA: NEGATIVE
Nitrite, UA: NEGATIVE
PROTEIN UA: NEGATIVE
Specific Gravity, UA: 1.029 (ref 1.005–1.030)
Urobilinogen, Ur: 0.2 mg/dL (ref 0.2–1.0)
pH, UA: 6 (ref 5.0–7.5)

## 2015-10-08 LAB — VARICELLA ZOSTER ANTIBODY, IGM: Varicella IgM: <0.91 index (ref 0.00–0.90)

## 2015-10-08 LAB — CBC WITH DIFFERENTIAL/PLATELET
BASOS: 0 %
Basophils Absolute: 0 10*3/uL (ref 0.0–0.2)
EOS (ABSOLUTE): 0.1 10*3/uL (ref 0.0–0.4)
EOS: 1 %
HEMATOCRIT: 35.5 % (ref 34.0–46.6)
Hemoglobin: 11.8 g/dL (ref 11.1–15.9)
IMMATURE GRANS (ABS): 0 10*3/uL (ref 0.0–0.1)
IMMATURE GRANULOCYTES: 0 %
LYMPHS: 26 %
Lymphocytes Absolute: 1.5 10*3/uL (ref 0.7–3.1)
MCH: 29.9 pg (ref 26.6–33.0)
MCHC: 33.2 g/dL (ref 31.5–35.7)
MCV: 90 fL (ref 79–97)
Monocytes Absolute: 0.5 10*3/uL (ref 0.1–0.9)
Monocytes: 8 %
NEUTROS ABS: 3.7 10*3/uL (ref 1.4–7.0)
Neutrophils: 65 %
PLATELETS: 244 10*3/uL (ref 150–379)
RBC: 3.95 x10E6/uL (ref 3.77–5.28)
RDW: 13 % (ref 12.3–15.4)
WBC: 5.7 10*3/uL (ref 3.4–10.8)

## 2015-10-08 LAB — GC/CHLAMYDIA PROBE AMP
Chlamydia trachomatis, NAA: NEGATIVE
Neisseria gonorrhoeae by PCR: NEGATIVE

## 2015-10-08 LAB — HEMOGLOBIN A1C
ESTIMATED AVERAGE GLUCOSE: 114 mg/dL
HEMOGLOBIN A1C: 5.6 % (ref 4.8–5.6)

## 2015-10-08 LAB — RUBELLA ANTIBODY, IGM

## 2015-10-08 LAB — CULTURE, OB URINE

## 2015-10-08 LAB — URINE CULTURE, OB REFLEX

## 2015-10-08 LAB — ABO

## 2015-10-08 LAB — HEPATITIS B SURFACE ANTIGEN: Hepatitis B Surface Ag: NEGATIVE

## 2015-10-08 LAB — RPR: RPR: NONREACTIVE

## 2015-10-08 LAB — TSH: TSH: 1.58 u[IU]/mL (ref 0.450–4.500)

## 2015-10-08 LAB — HIV ANTIBODY (ROUTINE TESTING W REFLEX): HIV Screen 4th Generation wRfx: NONREACTIVE

## 2015-10-08 LAB — ANTIBODY SCREEN: Antibody Screen: NEGATIVE

## 2015-10-08 LAB — RH TYPE: Rh Factor: NEGATIVE

## 2015-10-09 ENCOUNTER — Other Ambulatory Visit: Payer: Self-pay | Admitting: Obstetrics and Gynecology

## 2015-10-09 DIAGNOSIS — O26899 Other specified pregnancy related conditions, unspecified trimester: Secondary | ICD-10-CM

## 2015-10-09 DIAGNOSIS — O360190 Maternal care for anti-D [Rh] antibodies, unspecified trimester, not applicable or unspecified: Secondary | ICD-10-CM

## 2015-10-09 DIAGNOSIS — O09899 Supervision of other high risk pregnancies, unspecified trimester: Secondary | ICD-10-CM

## 2015-10-09 DIAGNOSIS — O9989 Other specified diseases and conditions complicating pregnancy, childbirth and the puerperium: Secondary | ICD-10-CM

## 2015-10-09 DIAGNOSIS — Z2839 Other underimmunization status: Secondary | ICD-10-CM | POA: Insufficient documentation

## 2015-10-09 DIAGNOSIS — Z6791 Unspecified blood type, Rh negative: Secondary | ICD-10-CM | POA: Insufficient documentation

## 2015-10-09 DIAGNOSIS — Z283 Underimmunization status: Secondary | ICD-10-CM

## 2015-10-11 ENCOUNTER — Ambulatory Visit (INDEPENDENT_AMBULATORY_CARE_PROVIDER_SITE_OTHER): Payer: Managed Care, Other (non HMO)

## 2015-10-11 DIAGNOSIS — Z36 Encounter for antenatal screening of mother: Secondary | ICD-10-CM

## 2015-10-11 DIAGNOSIS — Z331 Pregnant state, incidental: Secondary | ICD-10-CM | POA: Diagnosis not present

## 2015-10-11 DIAGNOSIS — N926 Irregular menstruation, unspecified: Secondary | ICD-10-CM

## 2015-10-11 DIAGNOSIS — Z3490 Encounter for supervision of normal pregnancy, unspecified, unspecified trimester: Secondary | ICD-10-CM

## 2015-10-11 DIAGNOSIS — Z3687 Encounter for antenatal screening for uncertain dates: Secondary | ICD-10-CM

## 2015-10-11 DIAGNOSIS — Z369 Encounter for antenatal screening, unspecified: Secondary | ICD-10-CM

## 2015-10-30 ENCOUNTER — Ambulatory Visit: Payer: Self-pay | Admitting: Physician Assistant

## 2015-10-30 ENCOUNTER — Encounter: Payer: Self-pay | Admitting: Physician Assistant

## 2015-10-30 VITALS — BP 110/70 | HR 89 | Temp 98.1°F

## 2015-10-30 DIAGNOSIS — Z299 Encounter for prophylactic measures, unspecified: Secondary | ICD-10-CM

## 2015-10-30 DIAGNOSIS — R300 Dysuria: Secondary | ICD-10-CM

## 2015-10-30 LAB — POCT URINALYSIS DIPSTICK
Bilirubin, UA: NEGATIVE
Blood, UA: NEGATIVE
GLUCOSE UA: NEGATIVE
KETONES UA: NEGATIVE
Leukocytes, UA: NEGATIVE
Nitrite, UA: NEGATIVE
Urobilinogen, UA: 0.2
pH, UA: 5.5

## 2015-10-30 NOTE — Progress Notes (Signed)
S: c/o painful urination, states "it hurts down there", no fever/chills, is 10 weeks preg, no vag d/c or bleeding  O: vitals wnl, nad, ua wnl  A: dysuria  P: f/u with gyn on Thurs, will order culture on urine

## 2015-11-01 ENCOUNTER — Encounter: Payer: Self-pay | Admitting: Obstetrics and Gynecology

## 2015-11-01 ENCOUNTER — Ambulatory Visit (INDEPENDENT_AMBULATORY_CARE_PROVIDER_SITE_OTHER): Payer: Managed Care, Other (non HMO) | Admitting: Obstetrics and Gynecology

## 2015-11-01 VITALS — BP 106/68 | HR 98 | Wt 225.3 lb

## 2015-11-01 DIAGNOSIS — O9989 Other specified diseases and conditions complicating pregnancy, childbirth and the puerperium: Secondary | ICD-10-CM

## 2015-11-01 DIAGNOSIS — O09219 Supervision of pregnancy with history of pre-term labor, unspecified trimester: Secondary | ICD-10-CM | POA: Insufficient documentation

## 2015-11-01 DIAGNOSIS — Z789 Other specified health status: Secondary | ICD-10-CM

## 2015-11-01 DIAGNOSIS — Z283 Underimmunization status: Secondary | ICD-10-CM

## 2015-11-01 DIAGNOSIS — O09899 Supervision of other high risk pregnancies, unspecified trimester: Secondary | ICD-10-CM

## 2015-11-01 DIAGNOSIS — Z3491 Encounter for supervision of normal pregnancy, unspecified, first trimester: Secondary | ICD-10-CM

## 2015-11-01 DIAGNOSIS — O09529 Supervision of elderly multigravida, unspecified trimester: Secondary | ICD-10-CM | POA: Insufficient documentation

## 2015-11-01 DIAGNOSIS — N9089 Other specified noninflammatory disorders of vulva and perineum: Secondary | ICD-10-CM

## 2015-11-01 LAB — POCT URINALYSIS DIPSTICK
Bilirubin, UA: NEGATIVE
GLUCOSE UA: NEGATIVE
Ketones, UA: NEGATIVE
Leukocytes, UA: NEGATIVE
NITRITE UA: NEGATIVE
SPEC GRAV UA: 1.015
UROBILINOGEN UA: 0.2
pH, UA: 5

## 2015-11-01 LAB — URINE CULTURE

## 2015-11-01 MED ORDER — VALACYCLOVIR HCL 500 MG PO TABS: 500.0000 mg | ORAL_TABLET | Freq: Two times a day (BID) | ORAL | Status: DC

## 2015-11-01 NOTE — Progress Notes (Signed)
NOB- pt is having some vaginal issues, itchy, looks like blisters, also above L eye- lesion, itches and painful
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
## 2015-11-02 LAB — HSV 1 AND 2 IGM ABS, INDIRECT

## 2015-11-04 LAB — HERPES SIMPLEX VIRUS CULTURE

## 2015-11-05 ENCOUNTER — Other Ambulatory Visit: Payer: Self-pay | Admitting: Obstetrics and Gynecology

## 2015-11-05 DIAGNOSIS — B009 Herpesviral infection, unspecified: Secondary | ICD-10-CM | POA: Insufficient documentation

## 2015-11-05 LAB — CT NG TV HSV BY NAA
CHLAMYDIA BY NAA: NEGATIVE
GONOCOCCUS BY NAA: NEGATIVE
HSV 1 NAA: NEGATIVE
HSV 2 NAA: POSITIVE — AB
TRICH VAG BY NAA: NEGATIVE

## 2015-11-13 ENCOUNTER — Other Ambulatory Visit: Payer: Self-pay | Admitting: Obstetrics and Gynecology

## 2015-11-13 ENCOUNTER — Encounter: Payer: Self-pay | Admitting: Obstetrics and Gynecology

## 2015-11-15 ENCOUNTER — Encounter: Payer: Self-pay | Admitting: Obstetrics and Gynecology

## 2015-12-07 ENCOUNTER — Encounter: Payer: Self-pay | Admitting: Obstetrics and Gynecology

## 2015-12-07 ENCOUNTER — Other Ambulatory Visit: Payer: Self-pay | Admitting: Obstetrics and Gynecology

## 2015-12-07 ENCOUNTER — Other Ambulatory Visit: Payer: Managed Care, Other (non HMO)

## 2015-12-07 ENCOUNTER — Ambulatory Visit (INDEPENDENT_AMBULATORY_CARE_PROVIDER_SITE_OTHER): Payer: Managed Care, Other (non HMO) | Admitting: Obstetrics and Gynecology

## 2015-12-07 VITALS — BP 94/64 | HR 85 | Wt 228.0 lb

## 2015-12-07 DIAGNOSIS — Z3492 Encounter for supervision of normal pregnancy, unspecified, second trimester: Secondary | ICD-10-CM

## 2015-12-07 LAB — POCT URINALYSIS DIPSTICK
Bilirubin, UA: NEGATIVE
GLUCOSE UA: NEGATIVE
Ketones, UA: NEGATIVE
Leukocytes, UA: NEGATIVE
Nitrite, UA: NEGATIVE
SPEC GRAV UA: 1.015
UROBILINOGEN UA: 0.2
pH, UA: 6.5

## 2015-12-07 NOTE — Progress Notes (Signed)
ROB- early glucola done today, no complaints, anatomy scan next visit.

## 2015-12-07 NOTE — Progress Notes (Signed)
ROB- pt denies any new complaints 

## 2015-12-08 LAB — GLUCOSE, 1 HOUR GESTATIONAL: Gestational Diabetes Screen: 83 mg/dL (ref 65–139)

## 2016-01-03 ENCOUNTER — Telehealth: Payer: Self-pay | Admitting: Obstetrics and Gynecology

## 2016-01-03 NOTE — Telephone Encounter (Signed)
pls advise

## 2016-01-03 NOTE — Telephone Encounter (Signed)
Please avoid and physical contact if possible- skin, saliva etc. And let us know if she develops any symptoms. Nothing to be worried about unless she develops symptoms.

## 2016-01-03 NOTE — Telephone Encounter (Signed)
Notified pt she voiced understanding 

## 2016-01-03 NOTE — Telephone Encounter (Signed)
Pt said a co worker may have shingles in her office and she has been in contact with her. What should she do?

## 2016-01-11 ENCOUNTER — Ambulatory Visit (INDEPENDENT_AMBULATORY_CARE_PROVIDER_SITE_OTHER): Payer: Managed Care, Other (non HMO) | Admitting: Obstetrics and Gynecology

## 2016-01-11 ENCOUNTER — Ambulatory Visit (INDEPENDENT_AMBULATORY_CARE_PROVIDER_SITE_OTHER): Payer: Managed Care, Other (non HMO)

## 2016-01-11 VITALS — BP 112/70 | HR 94 | Wt 231.2 lb

## 2016-01-11 DIAGNOSIS — Z3492 Encounter for supervision of normal pregnancy, unspecified, second trimester: Secondary | ICD-10-CM

## 2016-01-11 DIAGNOSIS — R12 Heartburn: Secondary | ICD-10-CM

## 2016-01-11 DIAGNOSIS — O26899 Other specified pregnancy related conditions, unspecified trimester: Secondary | ICD-10-CM

## 2016-01-11 LAB — POCT URINALYSIS DIPSTICK
BILIRUBIN UA: NEGATIVE
Blood, UA: NEGATIVE
GLUCOSE UA: NEGATIVE
KETONES UA: NEGATIVE
LEUKOCYTES UA: NEGATIVE
NITRITE UA: NEGATIVE
PH UA: 6
Protein, UA: NEGATIVE
Spec Grav, UA: 1.01
Urobilinogen, UA: 0.2

## 2016-01-11 MED ORDER — RANITIDINE HCL 150 MG PO TABS: 150.0000 mg | ORAL_TABLET | Freq: Two times a day (BID) | ORAL | 4 refills | Status: DC

## 2016-01-11 NOTE — Progress Notes (Signed)
ROB- pt is having heart burn very badly, she has been nauseated last 2 days

## 2016-01-11 NOTE — Progress Notes (Signed)
ROB & Anatomy scan- Indications: Anatomy Findings:  Singleton intrauterine pregnancy is visualized with FHR at 162 BPM. Biometrics give an (U/S) Gestational age of [redacted] weeks and 2 days, and an (U/S) EDD of 05/28/16; this correlates with the clinically established EDD of 05/25/16.  Fetal presentation is Variable (breech to start, Vertex at end).  EFW: 355 grams ( 0 lbs. 13 oz. ) Placenta: Posterior/Fundal, grade 0 and remote to cervix by 6.0 cm. AFI: Adequate with a MVP of 3.6 cm.  Anatomic survey is complete and appears WNL. Gender - Female.   Right and left ovaries were not visualized.  There is no evidence of a corpus luteal cyst. Survey of the adnexa demonstrates no adnexal masses. There is no free peritoneal fluid in the cul de sac.  Impression: 1. 20 week 2 day Viable Singleton Intrauterine pregnancy by U/S. 2. (U/S) EDD is consistent with Clinically established (LMP) EDD of 05/25/16. 3. Normal appearing anatomy scan.   C/o daily heartburn- not relieved with TUMS- switch to zantac bid

## 2016-01-24 ENCOUNTER — Encounter: Payer: Self-pay | Admitting: Obstetrics and Gynecology

## 2016-01-24 ENCOUNTER — Other Ambulatory Visit: Payer: Self-pay | Admitting: Obstetrics and Gynecology

## 2016-01-24 ENCOUNTER — Telehealth: Payer: Self-pay | Admitting: Obstetrics and Gynecology

## 2016-01-24 MED ORDER — PANTOPRAZOLE SODIUM 20 MG PO TBEC: 20.0000 mg | DELAYED_RELEASE_TABLET | Freq: Every day | ORAL | 5 refills | Status: DC

## 2016-01-24 NOTE — Telephone Encounter (Signed)
Patient called stating she is still having heartburn even with taking the medication she was given. She is [redacted] weeks pregnant.

## 2016-01-24 NOTE — Telephone Encounter (Signed)
Please tell her to stop the zantac and start the new med I sent in- it is once a day and tends to work better.

## 2016-01-24 NOTE — Telephone Encounter (Signed)
pls advise

## 2016-01-24 NOTE — Telephone Encounter (Signed)
Left detailed message for pt 

## 2016-02-15 ENCOUNTER — Ambulatory Visit (INDEPENDENT_AMBULATORY_CARE_PROVIDER_SITE_OTHER): Payer: Managed Care, Other (non HMO) | Admitting: Obstetrics and Gynecology

## 2016-02-15 VITALS — BP 108/73 | HR 95 | Wt 237.1 lb

## 2016-02-15 DIAGNOSIS — R21 Rash and other nonspecific skin eruption: Secondary | ICD-10-CM

## 2016-02-15 DIAGNOSIS — Z3492 Encounter for supervision of normal pregnancy, unspecified, second trimester: Secondary | ICD-10-CM

## 2016-02-15 LAB — POCT URINALYSIS DIPSTICK
Bilirubin, UA: NEGATIVE
Glucose, UA: NEGATIVE
Ketones, UA: NEGATIVE
Leukocytes, UA: NEGATIVE
NITRITE UA: NEGATIVE
PH UA: 6.5
Protein, UA: NEGATIVE
RBC UA: NEGATIVE
Spec Grav, UA: 1.015
UROBILINOGEN UA: 0.2

## 2016-02-15 NOTE — Patient Instructions (Signed)
Postpartum Tubal Ligation Postpartum tubal ligation (PPTL) is a procedure that closes the fallopian tubes right after childbirth or 1-2 days after childbirth. PPTL is done before the uterus returns to its normal location. The procedure is also called a mini-laparotomy. When the fallopian tubes are closed, the eggs that are released from the ovaries cannot enter the uterus, and sperm cannot reach the egg. PPTL is done so you will not be able to get pregnant or have a baby. Although this procedure may be undone (reversed), it should be considered permanent and irreversible. If you want to have future pregnancies, you should not have this procedure. LET YOUR HEALTH CARE PROVIDER KNOW ABOUT:  Any allergies you have.  All medicines you are taking, including vitamins, herbs, eye drops, creams, and over-the-counter medicines. This includes any use of steroids, either by mouth or in cream form.  Previous problems you or members of your family have had with the use of anesthetics.  Any blood disorders you have.  Previous surgeries you have had.  Any medical conditions you may have. RISKS AND COMPLICATIONS  Infection.  Bleeding.  Injury to surrounding organs.  Side effects from anesthetics.  Failure of the procedure.  Ectopic pregnancy.  Future regret about having the procedure done. BEFORE THE PROCEDURE  You may need to sign certain documents, including an informed consent form, up to 30 days before the date of your tubal ligation.  Follow instructions from your health care provider about eating and drinking restrictions. PROCEDURE  If done 1-2 days after a vaginal delivery:  You will be given one or more of the following:  A medicine that helps you relax (sedative).  A medicine that numbs the area (local anesthetic).  A medicine that makes you fall asleep (general anesthetic).  A medicine that is injected into an area of your body that numbs everything below the injection site  (regional anesthetic).  If you have been given general anesthetic, a tube will be put down your throat to help you breathe.  Your bladder may be emptied with a small tube (catheter).  A small cut (incision) will be made just above the pubic hair line.  The fallopian tubes will be located and brought up through the incision.  The fallopian tubes will be tied off or burned (cauterized), or they will be closed with a clamp, ring, or clip. In many cases, a small portion in the center of each fallopian tube will also be removed.  The incision will be closed with stitches (sutures).  A bandage (dressing) will be placed over the incision. The procedure may vary among health care providers and hospitals. If done after a cesarean delivery:  Tubal ligation will be done through the incision that was used for the cesarean delivery of your baby.  After the tubes are closed, the incision will be closed with stitches (sutures).  A bandage (dressing) will be placed over the incision. The procedure may vary among health care providers and hospitals. AFTER THE PROCEDURE  Your blood pressure, heart rate, breathing rate, and blood oxygen level will be monitored often until the medicines you were given have worn off.  You will be given pain medicine as needed.  If you had general anesthetic, you may have some mild discomfort in your throat. This is from the breathing tube that was placed in your throat while you were sleeping.  You may feel tired, and you should rest for the remainder of the day.  You may have some pain   or cramps in the abdominal area for 3-7 days.   This information is not intended to replace advice given to you by your health care provider. Make sure you discuss any questions you have with your health care provider.   Document Released: 06/02/2005 Document Revised: 10/17/2014 Document Reviewed: 09/13/2011 Elsevier Interactive Patient Education 2016 Elsevier Inc.  

## 2016-02-15 NOTE — Progress Notes (Signed)
ROB- doing well, glucola next visit. Referral to dermatology, OK to continue hydrocortisone until appt.

## 2016-02-15 NOTE — Progress Notes (Signed)
ROB- pt is doing well,L eye rash- itches, burns , painful

## 2016-03-06 ENCOUNTER — Other Ambulatory Visit: Payer: Self-pay | Admitting: *Deleted

## 2016-03-06 DIAGNOSIS — Z3493 Encounter for supervision of normal pregnancy, unspecified, third trimester: Secondary | ICD-10-CM

## 2016-03-07 ENCOUNTER — Ambulatory Visit (INDEPENDENT_AMBULATORY_CARE_PROVIDER_SITE_OTHER): Payer: Managed Care, Other (non HMO) | Admitting: Obstetrics and Gynecology

## 2016-03-07 VITALS — BP 103/72 | HR 101 | Wt 239.1 lb

## 2016-03-07 DIAGNOSIS — Z3493 Encounter for supervision of normal pregnancy, unspecified, third trimester: Secondary | ICD-10-CM

## 2016-03-07 DIAGNOSIS — Z23 Encounter for immunization: Secondary | ICD-10-CM

## 2016-03-07 DIAGNOSIS — Z418 Encounter for other procedures for purposes other than remedying health state: Secondary | ICD-10-CM | POA: Diagnosis not present

## 2016-03-07 DIAGNOSIS — Z2913 Encounter for prophylactic Rho(D) immune globulin: Secondary | ICD-10-CM

## 2016-03-07 LAB — POCT URINALYSIS DIPSTICK
GLUCOSE UA: NEGATIVE
Ketones, UA: NEGATIVE
NITRITE UA: NEGATIVE
PH UA: 6.5
SPEC GRAV UA: 1.015
UROBILINOGEN UA: 0.2

## 2016-03-07 MED ORDER — TETANUS-DIPHTH-ACELL PERTUSSIS 5-2.5-18.5 LF-MCG/0.5 IM SUSP
0.5000 mL | Freq: Once | INTRAMUSCULAR | Status: AC
  Administered 2016-03-07: 0.5 mL via INTRAMUSCULAR

## 2016-03-07 MED ORDER — RHO D IMMUNE GLOBULIN 1500 UNITS IM SOSY
1500.0000 [IU] | PREFILLED_SYRINGE | Freq: Once | INTRAMUSCULAR | Status: AC
  Administered 2016-03-07: 1500 [IU] via INTRAMUSCULAR

## 2016-03-07 NOTE — Progress Notes (Signed)
ROB-doing well, reports RUQ pain- fundus is at the level of pain on exam. Reassured, signed medicaid tubal papers, Rhogam, Flu and TDAP vaccines given.

## 2016-03-07 NOTE — Progress Notes (Signed)
ROB- blood consent signed, glucola, tdap & flu vaccine given today Pt is having some pressure

## 2016-03-07 NOTE — Patient Instructions (Signed)

## 2016-03-08 LAB — HEMOGLOBIN AND HEMATOCRIT, BLOOD
HEMOGLOBIN: 10.5 g/dL — AB (ref 11.1–15.9)
Hematocrit: 30.8 % — ABNORMAL LOW (ref 34.0–46.6)

## 2016-03-08 LAB — GLUCOSE, 1 HOUR GESTATIONAL: GESTATIONAL DIABETES SCREEN: 132 mg/dL (ref 65–139)

## 2016-03-10 ENCOUNTER — Telehealth: Payer: Self-pay | Admitting: Obstetrics and Gynecology

## 2016-03-10 NOTE — Telephone Encounter (Signed)
PT CALLED AND SHE NEEDED YOU TO CALL HER BACK SHE WANTED TO TALK TO YOU ABOUT THE CANCER FORM THAT SHE NEEDED TO FILL OUT.

## 2016-03-11 ENCOUNTER — Other Ambulatory Visit: Payer: Self-pay | Admitting: Obstetrics and Gynecology

## 2016-03-11 MED ORDER — FERRALET 90 90-1 MG PO TABS: 1.0000 | ORAL_TABLET | Freq: Every day | ORAL | 4 refills | Status: DC

## 2016-03-11 NOTE — Telephone Encounter (Signed)
Spoke with pt

## 2016-03-21 ENCOUNTER — Ambulatory Visit (INDEPENDENT_AMBULATORY_CARE_PROVIDER_SITE_OTHER): Payer: Managed Care, Other (non HMO) | Admitting: Obstetrics and Gynecology

## 2016-03-21 VITALS — BP 109/64 | HR 105 | Wt 239.7 lb

## 2016-03-21 DIAGNOSIS — Z3493 Encounter for supervision of normal pregnancy, unspecified, third trimester: Secondary | ICD-10-CM

## 2016-03-21 DIAGNOSIS — Z8041 Family history of malignant neoplasm of ovary: Secondary | ICD-10-CM | POA: Insufficient documentation

## 2016-03-21 DIAGNOSIS — Z803 Family history of malignant neoplasm of breast: Secondary | ICD-10-CM | POA: Insufficient documentation

## 2016-03-21 LAB — POCT URINALYSIS DIPSTICK
BILIRUBIN UA: NEGATIVE
Glucose, UA: NEGATIVE
KETONES UA: NEGATIVE
Nitrite, UA: NEGATIVE
PH UA: 6.5
PROTEIN UA: NEGATIVE
RBC UA: NEGATIVE
Spec Grav, UA: 1.015
Urobilinogen, UA: 0.2

## 2016-03-21 NOTE — Progress Notes (Signed)
ROB- Recommended chiropractor for rib pain. Info for genetic testing obtained, will draw blood for MyRisks at next visit.

## 2016-03-21 NOTE — Progress Notes (Signed)
ROB- pt is having upper abd pain, hurts when she stands, when she sits

## 2016-03-31 ENCOUNTER — Telehealth: Payer: Self-pay | Admitting: Obstetrics and Gynecology

## 2016-03-31 ENCOUNTER — Other Ambulatory Visit: Payer: Self-pay | Admitting: *Deleted

## 2016-03-31 MED ORDER — FLUCONAZOLE 150 MG PO TABS: 150.0000 mg | ORAL_TABLET | Freq: Once | ORAL | 1 refills | Status: AC

## 2016-03-31 NOTE — Telephone Encounter (Signed)
Sent pt in diflucan 

## 2016-03-31 NOTE — Telephone Encounter (Signed)
PT CALLED AND SHE IS [redacted] WEEKS PREGNANT AND SHE THINKS SHE IS GETTING A YEAST INFECTION, BUT SHE WASN'T SURE WHAT SHE CAN USE, PT WOULD LIKE A CALL BACK.

## 2016-04-04 ENCOUNTER — Ambulatory Visit (INDEPENDENT_AMBULATORY_CARE_PROVIDER_SITE_OTHER): Payer: Managed Care, Other (non HMO) | Admitting: Obstetrics and Gynecology

## 2016-04-04 VITALS — BP 108/73 | HR 108 | Wt 243.1 lb

## 2016-04-04 DIAGNOSIS — Z3493 Encounter for supervision of normal pregnancy, unspecified, third trimester: Secondary | ICD-10-CM

## 2016-04-04 LAB — POCT URINALYSIS DIPSTICK
BILIRUBIN UA: NEGATIVE
Glucose, UA: NEGATIVE
Ketones, UA: NEGATIVE
Leukocytes, UA: NEGATIVE
NITRITE UA: NEGATIVE
PH UA: 6.5
RBC UA: NEGATIVE
Spec Grav, UA: 1.015
UROBILINOGEN UA: 0.2

## 2016-04-04 NOTE — Progress Notes (Signed)
ROB- B feet swelling, legs,can feel numbness and tingling

## 2016-04-04 NOTE — Progress Notes (Signed)
ROB-recommend compression socks.

## 2016-04-18 ENCOUNTER — Ambulatory Visit (INDEPENDENT_AMBULATORY_CARE_PROVIDER_SITE_OTHER): Payer: Managed Care, Other (non HMO) | Admitting: Obstetrics and Gynecology

## 2016-04-18 VITALS — BP 120/76 | HR 94 | Wt 238.2 lb

## 2016-04-18 DIAGNOSIS — Z3493 Encounter for supervision of normal pregnancy, unspecified, third trimester: Secondary | ICD-10-CM

## 2016-04-18 LAB — POCT URINALYSIS DIPSTICK
Bilirubin, UA: NEGATIVE
GLUCOSE UA: NEGATIVE
Ketones, UA: NEGATIVE
LEUKOCYTES UA: NEGATIVE
NITRITE UA: NEGATIVE
PH UA: 6
Protein, UA: NEGATIVE
RBC UA: NEGATIVE
Spec Grav, UA: 1.015
UROBILINOGEN UA: 0.2

## 2016-04-18 NOTE — Progress Notes (Signed)
ROB- decreased appetite, sister's cancer returned, patient had MyRisks obtained today.

## 2016-04-18 NOTE — Progress Notes (Signed)
ROB- pt is doing ok, states she doesn't really have an appetite

## 2016-04-23 ENCOUNTER — Telehealth: Payer: Self-pay | Admitting: Obstetrics and Gynecology

## 2016-04-23 NOTE — Telephone Encounter (Signed)
Patient called stating she has a question for you If you could give her a call back

## 2016-04-23 NOTE — Telephone Encounter (Signed)
Called pt we discussed her situation

## 2016-04-30 ENCOUNTER — Telehealth: Payer: Self-pay | Admitting: Obstetrics and Gynecology

## 2016-04-30 NOTE — Telephone Encounter (Signed)
Call Tab about Anna Gaines, HE LEFT A MSG ON vm ABOUT A QUESTION FOR DATE 04/25/16

## 2016-05-01 ENCOUNTER — Other Ambulatory Visit: Payer: Self-pay | Admitting: *Deleted

## 2016-05-01 DIAGNOSIS — Z3685 Encounter for antenatal screening for Streptococcus B: Secondary | ICD-10-CM

## 2016-05-01 DIAGNOSIS — Z113 Encounter for screening for infections with a predominantly sexual mode of transmission: Secondary | ICD-10-CM

## 2016-05-02 ENCOUNTER — Ambulatory Visit (INDEPENDENT_AMBULATORY_CARE_PROVIDER_SITE_OTHER): Payer: Managed Care, Other (non HMO) | Admitting: Obstetrics and Gynecology

## 2016-05-02 ENCOUNTER — Other Ambulatory Visit: Payer: Self-pay | Admitting: Obstetrics and Gynecology

## 2016-05-02 VITALS — BP 123/84 | HR 108 | Wt 244.8 lb

## 2016-05-02 DIAGNOSIS — Z3493 Encounter for supervision of normal pregnancy, unspecified, third trimester: Secondary | ICD-10-CM

## 2016-05-02 LAB — POCT URINALYSIS DIPSTICK
Glucose, UA: NEGATIVE
Ketones, UA: NEGATIVE
LEUKOCYTES UA: NEGATIVE
NITRITE UA: NEGATIVE
PH UA: 6
RBC UA: NEGATIVE
Spec Grav, UA: 1.015
UROBILINOGEN UA: 0.2

## 2016-05-02 NOTE — Progress Notes (Signed)
ROB- cultures obtained, pt is having a lot of pelvic pressure, some contractions

## 2016-05-02 NOTE — Patient Instructions (Signed)
  Place 32-42 weeks prenatal visit patient instructions here.  

## 2016-05-02 NOTE — Progress Notes (Signed)
ROB- reports overall doing well, some pelvic pressure and continues to complain of right rib pain. Denies urinary symptoms, discharge, spotting or any other complaints. Reports not as much movement today but otherwise moving well. Cultures obtained today. Follow up in 1 week for ROB.

## 2016-05-06 LAB — GC/CHLAMYDIA PROBE AMP
CHLAMYDIA, DNA PROBE: NEGATIVE
NEISSERIA GONORRHOEAE BY PCR: NEGATIVE

## 2016-05-06 LAB — STREP GP B NAA: Strep Gp B NAA: NEGATIVE

## 2016-05-07 ENCOUNTER — Ambulatory Visit (INDEPENDENT_AMBULATORY_CARE_PROVIDER_SITE_OTHER): Payer: Managed Care, Other (non HMO) | Admitting: Obstetrics and Gynecology

## 2016-05-07 VITALS — BP 112/76 | HR 94 | Wt 243.2 lb

## 2016-05-07 DIAGNOSIS — Z3493 Encounter for supervision of normal pregnancy, unspecified, third trimester: Secondary | ICD-10-CM

## 2016-05-07 LAB — POCT URINALYSIS DIPSTICK
GLUCOSE UA: NEGATIVE
KETONES UA: 15
Leukocytes, UA: NEGATIVE
Nitrite, UA: NEGATIVE
RBC UA: NEGATIVE
SPEC GRAV UA: 1.015
Urobilinogen, UA: 0.2
pH, UA: 6.5

## 2016-05-07 NOTE — Progress Notes (Signed)
ROB-reviewed cultures,

## 2016-05-07 NOTE — Progress Notes (Signed)
ROB- pt is having pelvic pressure, some contractions, low back pain, still having upper abd pain

## 2016-05-12 ENCOUNTER — Inpatient Hospital Stay
Admission: EM | Admit: 2016-05-12 | Discharge: 2016-05-12 | Disposition: A | Discharge status: Home / Self Care | Payer: Managed Care, Other (non HMO) | Attending: Obstetrics and Gynecology | Admitting: Obstetrics and Gynecology

## 2016-05-12 DIAGNOSIS — O26899 Other specified pregnancy related conditions, unspecified trimester: Secondary | ICD-10-CM

## 2016-05-12 DIAGNOSIS — O9989 Other specified diseases and conditions complicating pregnancy, childbirth and the puerperium: Secondary | ICD-10-CM

## 2016-05-12 DIAGNOSIS — O09523 Supervision of elderly multigravida, third trimester: Secondary | ICD-10-CM

## 2016-05-12 DIAGNOSIS — Z6791 Unspecified blood type, Rh negative: Secondary | ICD-10-CM

## 2016-05-12 DIAGNOSIS — O471 False labor at or after 37 completed weeks of gestation: Secondary | ICD-10-CM | POA: Diagnosis not present

## 2016-05-12 DIAGNOSIS — O09219 Supervision of pregnancy with history of pre-term labor, unspecified trimester: Secondary | ICD-10-CM

## 2016-05-12 DIAGNOSIS — Z283 Underimmunization status: Secondary | ICD-10-CM

## 2016-05-12 DIAGNOSIS — Z3A39 39 weeks gestation of pregnancy: Secondary | ICD-10-CM | POA: Diagnosis not present

## 2016-05-12 DIAGNOSIS — O09899 Supervision of other high risk pregnancies, unspecified trimester: Secondary | ICD-10-CM

## 2016-05-12 NOTE — OB Triage Note (Signed)
Patient given discharge instructions including fetal kick count instructions, labor precautions, and follow up information. Patient verbalized understanding. Patient discharged home in stable condition, ambulatory, accompanied by son and husband.

## 2016-05-12 NOTE — OB Triage Note (Signed)
Patient arrived in triage with complaints of contractions since approx 0100 this morning. States she is uncertain of frequency.  States positive fetal movement, denies leaking of fluid and vaginal bleeding.  Denies any problems with this pregnancy. Abdomen palpated soft. EFM applied. Discussed plan of care. Patient verbalized understanding.

## 2016-05-14 ENCOUNTER — Ambulatory Visit (INDEPENDENT_AMBULATORY_CARE_PROVIDER_SITE_OTHER): Payer: Managed Care, Other (non HMO) | Admitting: Obstetrics and Gynecology

## 2016-05-14 VITALS — BP 115/75 | HR 106 | Wt 242.2 lb

## 2016-05-14 DIAGNOSIS — O09523 Supervision of elderly multigravida, third trimester: Secondary | ICD-10-CM

## 2016-05-14 DIAGNOSIS — B009 Herpesviral infection, unspecified: Secondary | ICD-10-CM

## 2016-05-14 LAB — POCT URINALYSIS DIPSTICK
BILIRUBIN UA: NEGATIVE
Blood, UA: NEGATIVE
Glucose, UA: NEGATIVE
KETONES UA: NEGATIVE
Leukocytes, UA: NEGATIVE
Nitrite, UA: NEGATIVE
PH UA: 6
PROTEIN UA: 1
SPEC GRAV UA: 1.025
Urobilinogen, UA: NEGATIVE

## 2016-05-14 NOTE — Patient Instructions (Signed)
Labor precautions reviewed.  Call office or follow up at Labor and Delivery for the following: 1) Contractions less than 10 min apart for longer than 1 hour 2) Rupture of membranes 3) Vaginal bleeding requiring a pad 4) Decreased fetal movement 

## 2016-05-14 NOTE — Progress Notes (Signed)
ROB: Continues to note pressure and low back pain. Cervix unchanged.Discussed labor precautions. RTC in 1 week.

## 2016-05-20 ENCOUNTER — Ambulatory Visit (INDEPENDENT_AMBULATORY_CARE_PROVIDER_SITE_OTHER): Payer: Managed Care, Other (non HMO) | Admitting: Obstetrics and Gynecology

## 2016-05-20 VITALS — BP 120/84 | HR 90 | Wt 244.4 lb

## 2016-05-20 DIAGNOSIS — Z3493 Encounter for supervision of normal pregnancy, unspecified, third trimester: Secondary | ICD-10-CM

## 2016-05-20 LAB — POCT URINALYSIS DIPSTICK
Bilirubin, UA: NEGATIVE
Blood, UA: NEGATIVE
Glucose, UA: NEGATIVE
Ketones, UA: NEGATIVE
NITRITE UA: NEGATIVE
SPEC GRAV UA: 1.015
UROBILINOGEN UA: 0.2
pH, UA: 6.5

## 2016-05-20 NOTE — Progress Notes (Signed)
ROB- pt is having a lot of pelvic pressure, low back pain, some lower abd cramping

## 2016-05-20 NOTE — Progress Notes (Signed)
ROB- will plan IOL on 05/23/16 if not delivered by then.

## 2016-05-20 NOTE — Progress Notes (Signed)
ROB- overall doing well. Reports some increased pelvic pressure. Back pain intermittent. Denies any urinary symptoms. Labor precautions discussed. Follow up in 1 week for ROB.

## 2016-05-21 ENCOUNTER — Inpatient Hospital Stay
Admission: EM | Admit: 2016-05-21 | Discharge: 2016-05-23 | DRG: 765 | Disposition: A | Discharge status: Home / Self Care | Payer: Managed Care, Other (non HMO) | Attending: Obstetrics and Gynecology | Admitting: Obstetrics and Gynecology

## 2016-05-21 ENCOUNTER — Encounter
Admission: EM | Disposition: A | Discharge status: Home / Self Care | Payer: Self-pay | Source: Home / Self Care | Attending: Obstetrics and Gynecology

## 2016-05-21 ENCOUNTER — Inpatient Hospital Stay: Payer: Managed Care, Other (non HMO) | Admitting: Certified Registered"

## 2016-05-21 DIAGNOSIS — A6000 Herpesviral infection of urogenital system, unspecified: Secondary | ICD-10-CM | POA: Diagnosis present

## 2016-05-21 DIAGNOSIS — Z3A39 39 weeks gestation of pregnancy: Secondary | ICD-10-CM

## 2016-05-21 DIAGNOSIS — O99214 Obesity complicating childbirth: Secondary | ICD-10-CM | POA: Diagnosis present

## 2016-05-21 DIAGNOSIS — Z283 Underimmunization status: Secondary | ICD-10-CM

## 2016-05-21 DIAGNOSIS — Z6791 Unspecified blood type, Rh negative: Secondary | ICD-10-CM

## 2016-05-21 DIAGNOSIS — Z3483 Encounter for supervision of other normal pregnancy, third trimester: Secondary | ICD-10-CM

## 2016-05-21 DIAGNOSIS — O09899 Supervision of other high risk pregnancies, unspecified trimester: Secondary | ICD-10-CM

## 2016-05-21 DIAGNOSIS — O9832 Other infections with a predominantly sexual mode of transmission complicating childbirth: Secondary | ICD-10-CM | POA: Diagnosis present

## 2016-05-21 DIAGNOSIS — Z302 Encounter for sterilization: Secondary | ICD-10-CM

## 2016-05-21 DIAGNOSIS — Z6841 Body Mass Index (BMI) 40.0 and over, adult: Secondary | ICD-10-CM

## 2016-05-21 DIAGNOSIS — O09523 Supervision of elderly multigravida, third trimester: Secondary | ICD-10-CM

## 2016-05-21 DIAGNOSIS — D649 Anemia, unspecified: Secondary | ICD-10-CM | POA: Diagnosis present

## 2016-05-21 DIAGNOSIS — O09219 Supervision of pregnancy with history of pre-term labor, unspecified trimester: Secondary | ICD-10-CM

## 2016-05-21 DIAGNOSIS — Z8249 Family history of ischemic heart disease and other diseases of the circulatory system: Secondary | ICD-10-CM | POA: Diagnosis not present

## 2016-05-21 DIAGNOSIS — Z3490 Encounter for supervision of normal pregnancy, unspecified, unspecified trimester: Secondary | ICD-10-CM

## 2016-05-21 DIAGNOSIS — O9902 Anemia complicating childbirth: Secondary | ICD-10-CM | POA: Diagnosis present

## 2016-05-21 DIAGNOSIS — Z3493 Encounter for supervision of normal pregnancy, unspecified, third trimester: Secondary | ICD-10-CM | POA: Diagnosis present

## 2016-05-21 DIAGNOSIS — O9989 Other specified diseases and conditions complicating pregnancy, childbirth and the puerperium: Secondary | ICD-10-CM

## 2016-05-21 DIAGNOSIS — O26899 Other specified pregnancy related conditions, unspecified trimester: Secondary | ICD-10-CM

## 2016-05-21 HISTORY — PX: TUBAL LIGATION: SHX77

## 2016-05-21 LAB — CBC
HEMATOCRIT: 30.3 % — AB (ref 35.0–47.0)
Hemoglobin: 10.7 g/dL — ABNORMAL LOW (ref 12.0–16.0)
MCH: 31.5 pg (ref 26.0–34.0)
MCHC: 35.4 g/dL (ref 32.0–36.0)
MCV: 89.1 fL (ref 80.0–100.0)
PLATELETS: 200 10*3/uL (ref 150–440)
RBC: 3.41 MIL/uL — ABNORMAL LOW (ref 3.80–5.20)
RDW: 13.7 % (ref 11.5–14.5)
WBC: 10.1 10*3/uL (ref 3.6–11.0)

## 2016-05-21 LAB — ABO/RH: ABO/RH(D): A NEG

## 2016-05-21 SURGERY — Surgical Case
Anesthesia: Spinal | Site: Abdomen | Wound class: Clean Contaminated

## 2016-05-21 MED ORDER — OXYCODONE-ACETAMINOPHEN 5-325 MG PO TABS: 2.0000 | ORAL_TABLET | ORAL | Status: DC | PRN

## 2016-05-21 MED ORDER — DIPHENHYDRAMINE HCL 50 MG/ML IJ SOLN: 12.5000 mg | INTRAMUSCULAR | Status: DC | PRN

## 2016-05-21 MED ORDER — ACETAMINOPHEN 325 MG PO TABS
ORAL_TABLET | ORAL | Status: AC
  Filled 2016-05-21: qty 2

## 2016-05-21 MED ORDER — MORPHINE SULFATE (PF) 0.5 MG/ML IJ SOLN
INTRAMUSCULAR | Status: DC | PRN
  Administered 2016-05-21: .1 mg via EPIDURAL

## 2016-05-21 MED ORDER — NALBUPHINE HCL 10 MG/ML IJ SOLN: 5.0000 mg | Freq: Once | INTRAMUSCULAR | Status: DC | PRN

## 2016-05-21 MED ORDER — COCONUT OIL OIL: 1.0000 "application " | TOPICAL_OIL | Status: DC | PRN

## 2016-05-21 MED ORDER — PRENATAL MULTIVITAMIN CH
1.0000 | ORAL_TABLET | Freq: Every day | ORAL | Status: DC
  Administered 2016-05-22 – 2016-05-23 (×2): 1 via ORAL
  Filled 2016-05-21 (×2): qty 1

## 2016-05-21 MED ORDER — KETOROLAC TROMETHAMINE 30 MG/ML IJ SOLN
INTRAMUSCULAR | Status: AC
  Administered 2016-05-21: 30 mg via INTRAVENOUS
  Filled 2016-05-21: qty 1

## 2016-05-21 MED ORDER — LACTATED RINGERS IV SOLN
500.0000 mL | INTRAVENOUS | Status: DC | PRN
  Administered 2016-05-21: 400 mL via INTRAVENOUS

## 2016-05-21 MED ORDER — NALOXONE HCL 2 MG/2ML IJ SOSY
1.0000 ug/kg/h | PREFILLED_SYRINGE | INTRAVENOUS | Status: DC | PRN
  Filled 2016-05-21: qty 2

## 2016-05-21 MED ORDER — SENNOSIDES-DOCUSATE SODIUM 8.6-50 MG PO TABS
2.0000 | ORAL_TABLET | ORAL | Status: DC
  Administered 2016-05-22 – 2016-05-23 (×2): 2 via ORAL
  Filled 2016-05-21 (×2): qty 2

## 2016-05-21 MED ORDER — MENTHOL 3 MG MT LOZG
1.0000 | LOZENGE | OROMUCOSAL | Status: DC | PRN
  Filled 2016-05-21: qty 9

## 2016-05-21 MED ORDER — KETOROLAC TROMETHAMINE 30 MG/ML IJ SOLN
30.0000 mg | Freq: Four times a day (QID) | INTRAMUSCULAR | Status: DC
  Administered 2016-05-21 (×2): 30 mg via INTRAVENOUS
  Filled 2016-05-21: qty 1

## 2016-05-21 MED ORDER — NALBUPHINE HCL 10 MG/ML IJ SOLN
INTRAMUSCULAR | Status: AC
  Administered 2016-05-21: 5 mg via INTRAVENOUS
  Filled 2016-05-21: qty 1

## 2016-05-21 MED ORDER — OXYCODONE-ACETAMINOPHEN 5-325 MG PO TABS: 1.0000 | ORAL_TABLET | ORAL | Status: DC | PRN

## 2016-05-21 MED ORDER — ACETAMINOPHEN 325 MG PO TABS
650.0000 mg | ORAL_TABLET | Freq: Four times a day (QID) | ORAL | Status: DC
  Administered 2016-05-22: 650 mg via ORAL
  Filled 2016-05-21: qty 2

## 2016-05-21 MED ORDER — SCOPOLAMINE 1 MG/3DAYS TD PT72: 1.0000 | MEDICATED_PATCH | Freq: Once | TRANSDERMAL | Status: DC

## 2016-05-21 MED ORDER — OXYCODONE HCL 5 MG PO TABS
5.0000 mg | ORAL_TABLET | ORAL | Status: DC | PRN
  Administered 2016-05-22 (×2): 5 mg via ORAL
  Filled 2016-05-21 (×2): qty 1

## 2016-05-21 MED ORDER — SIMETHICONE 80 MG PO CHEW: 80.0000 mg | CHEWABLE_TABLET | ORAL | Status: DC

## 2016-05-21 MED ORDER — OXYTOCIN 10 UNIT/ML IJ SOLN
INTRAMUSCULAR | Status: AC
  Filled 2016-05-21: qty 2

## 2016-05-21 MED ORDER — KETOROLAC TROMETHAMINE 30 MG/ML IJ SOLN
30.0000 mg | Freq: Four times a day (QID) | INTRAMUSCULAR | Status: AC
  Administered 2016-05-22 (×2): 30 mg via INTRAVENOUS
  Filled 2016-05-21 (×2): qty 1

## 2016-05-21 MED ORDER — OXYTOCIN 40 UNITS IN LACTATED RINGERS INFUSION - SIMPLE MED
1.0000 m[IU]/min | INTRAVENOUS | Status: DC
  Filled 2016-05-21: qty 1000

## 2016-05-21 MED ORDER — SOD CITRATE-CITRIC ACID 500-334 MG/5ML PO SOLN
ORAL | Status: AC
Start: 2016-05-21 — End: 2016-05-21
  Filled 2016-05-21: qty 15

## 2016-05-21 MED ORDER — FENTANYL CITRATE (PF) 100 MCG/2ML IJ SOLN
50.0000 ug | INTRAMUSCULAR | Status: DC | PRN
  Administered 2016-05-21: 100 ug via INTRAVENOUS
  Filled 2016-05-21: qty 2

## 2016-05-21 MED ORDER — MEPERIDINE HCL 25 MG/ML IJ SOLN: 6.2500 mg | INTRAMUSCULAR | Status: DC | PRN

## 2016-05-21 MED ORDER — ONDANSETRON HCL 4 MG/2ML IJ SOLN: 4.0000 mg | Freq: Four times a day (QID) | INTRAMUSCULAR | Status: DC | PRN

## 2016-05-21 MED ORDER — MAGNESIUM HYDROXIDE 400 MG/5ML PO SUSP: 30.0000 mL | ORAL | Status: DC | PRN

## 2016-05-21 MED ORDER — NALOXONE HCL 0.4 MG/ML IJ SOLN: 0.4000 mg | INTRAMUSCULAR | Status: DC | PRN

## 2016-05-21 MED ORDER — MISOPROSTOL 200 MCG PO TABS
ORAL_TABLET | ORAL | Status: AC
  Filled 2016-05-21: qty 4

## 2016-05-21 MED ORDER — ACETAMINOPHEN 325 MG PO TABS
650.0000 mg | ORAL_TABLET | Freq: Four times a day (QID) | ORAL | Status: DC
  Administered 2016-05-21 (×2): 650 mg via ORAL
  Filled 2016-05-21: qty 2

## 2016-05-21 MED ORDER — WITCH HAZEL-GLYCERIN EX PADS: 1.0000 "application " | MEDICATED_PAD | CUTANEOUS | Status: DC | PRN

## 2016-05-21 MED ORDER — SOD CITRATE-CITRIC ACID 500-334 MG/5ML PO SOLN
30.0000 mL | ORAL | Status: DC | PRN
  Administered 2016-05-21: 30 mL via ORAL

## 2016-05-21 MED ORDER — LIDOCAINE 5 % EX PTCH
1.0000 | MEDICATED_PATCH | CUTANEOUS | Status: DC
  Filled 2016-05-21: qty 1

## 2016-05-21 MED ORDER — BUPIVACAINE IN DEXTROSE 0.75-8.25 % IT SOLN
INTRATHECAL | Status: DC | PRN
  Administered 2016-05-21: 1.6 mL via INTRATHECAL

## 2016-05-21 MED ORDER — FERROUS SULFATE 325 (65 FE) MG PO TABS
325.0000 mg | ORAL_TABLET | Freq: Two times a day (BID) | ORAL | Status: DC
  Administered 2016-05-21 – 2016-05-23 (×4): 325 mg via ORAL
  Filled 2016-05-21 (×4): qty 1

## 2016-05-21 MED ORDER — SIMETHICONE 80 MG PO CHEW: 80.0000 mg | CHEWABLE_TABLET | ORAL | Status: DC | PRN

## 2016-05-21 MED ORDER — PHENYLEPHRINE HCL 10 MG/ML IJ SOLN
INTRAMUSCULAR | Status: DC | PRN
  Administered 2016-05-21 (×4): 100 ug via INTRAVENOUS

## 2016-05-21 MED ORDER — VARICELLA VIRUS VACCINE LIVE 1350 PFU/0.5ML IJ SUSR
0.5000 mL | INTRAMUSCULAR | Status: AC | PRN
  Administered 2016-05-23: 0.5 mL via SUBCUTANEOUS
  Filled 2016-05-21: qty 0.5

## 2016-05-21 MED ORDER — LIDOCAINE 5 % EX PTCH
MEDICATED_PATCH | CUTANEOUS | Status: DC | PRN
  Administered 2016-05-21: 1 via TRANSDERMAL

## 2016-05-21 MED ORDER — ONDANSETRON HCL 4 MG/2ML IJ SOLN: 4.0000 mg | Freq: Three times a day (TID) | INTRAMUSCULAR | Status: DC | PRN

## 2016-05-21 MED ORDER — LIDOCAINE HCL (PF) 1 % IJ SOLN: 30.0000 mL | INTRAMUSCULAR | Status: DC | PRN

## 2016-05-21 MED ORDER — NALBUPHINE HCL 10 MG/ML IJ SOLN
5.0000 mg | Freq: Once | INTRAMUSCULAR | Status: DC | PRN
  Filled 2016-05-21: qty 1

## 2016-05-21 MED ORDER — OXYTOCIN 40 UNITS IN LACTATED RINGERS INFUSION - SIMPLE MED
2.5000 [IU]/h | INTRAVENOUS | Status: DC
  Filled 2016-05-21: qty 1000

## 2016-05-21 MED ORDER — TERBUTALINE SULFATE 1 MG/ML IJ SOLN: 0.2500 mg | Freq: Once | INTRAMUSCULAR | Status: DC | PRN

## 2016-05-21 MED ORDER — SENNOSIDES-DOCUSATE SODIUM 8.6-50 MG PO TABS: 2.0000 | ORAL_TABLET | ORAL | Status: DC

## 2016-05-21 MED ORDER — DIPHENHYDRAMINE HCL 25 MG PO CAPS: 25.0000 mg | ORAL_CAPSULE | Freq: Four times a day (QID) | ORAL | Status: DC | PRN

## 2016-05-21 MED ORDER — LIDOCAINE HCL (PF) 1 % IJ SOLN
INTRAMUSCULAR | Status: AC
  Filled 2016-05-21: qty 30

## 2016-05-21 MED ORDER — DIPHENHYDRAMINE HCL 25 MG PO CAPS
25.0000 mg | ORAL_CAPSULE | ORAL | Status: DC | PRN
  Administered 2016-05-21: 25 mg via ORAL
  Filled 2016-05-21: qty 1

## 2016-05-21 MED ORDER — KETOROLAC TROMETHAMINE 30 MG/ML IJ SOLN: 30.0000 mg | Freq: Four times a day (QID) | INTRAMUSCULAR | Status: AC

## 2016-05-21 MED ORDER — IBUPROFEN 800 MG PO TABS
800.0000 mg | ORAL_TABLET | Freq: Four times a day (QID) | ORAL | Status: DC
  Administered 2016-05-22 (×2): 800 mg via ORAL
  Filled 2016-05-21 (×3): qty 1

## 2016-05-21 MED ORDER — NALBUPHINE HCL 10 MG/ML IJ SOLN
5.0000 mg | INTRAMUSCULAR | Status: DC | PRN
  Administered 2016-05-22: 5 mg via SUBCUTANEOUS
  Filled 2016-05-21: qty 1

## 2016-05-21 MED ORDER — NALBUPHINE HCL 10 MG/ML IJ SOLN
5.0000 mg | INTRAMUSCULAR | Status: DC | PRN
  Administered 2016-05-21 – 2016-05-22 (×4): 5 mg via INTRAVENOUS
  Filled 2016-05-21 (×3): qty 1

## 2016-05-21 MED ORDER — OXYTOCIN BOLUS FROM INFUSION: 500.0000 mL | Freq: Once | INTRAVENOUS | Status: DC

## 2016-05-21 MED ORDER — MEASLES, MUMPS & RUBELLA VAC ~~LOC~~ INJ
0.5000 mL | INJECTION | Freq: Once | SUBCUTANEOUS | Status: AC
  Administered 2016-05-23: 0.5 mL via SUBCUTANEOUS
  Filled 2016-05-21 (×2): qty 0.5

## 2016-05-21 MED ORDER — AMMONIA AROMATIC IN INHA
RESPIRATORY_TRACT | Status: AC
  Filled 2016-05-21: qty 10

## 2016-05-21 MED ORDER — DEXTROSE 5 % IV SOLN
500.0000 mg | INTRAVENOUS | Status: AC
  Administered 2016-05-21: 500 mg via INTRAVENOUS
  Filled 2016-05-21: qty 500

## 2016-05-21 MED ORDER — OXYCODONE HCL 5 MG PO TABS
10.0000 mg | ORAL_TABLET | ORAL | Status: DC | PRN
  Administered 2016-05-21 – 2016-05-23 (×8): 10 mg via ORAL
  Filled 2016-05-21 (×8): qty 2

## 2016-05-21 MED ORDER — ZOLPIDEM TARTRATE 5 MG PO TABS: 5.0000 mg | ORAL_TABLET | Freq: Every evening | ORAL | Status: DC | PRN

## 2016-05-21 MED ORDER — ACETAMINOPHEN 325 MG PO TABS: 650.0000 mg | ORAL_TABLET | ORAL | Status: DC | PRN

## 2016-05-21 MED ORDER — DIBUCAINE 1 % RE OINT: 1.0000 "application " | TOPICAL_OINTMENT | RECTAL | Status: DC | PRN

## 2016-05-21 MED ORDER — SODIUM CHLORIDE 0.9% FLUSH: 3.0000 mL | INTRAVENOUS | Status: DC | PRN

## 2016-05-21 MED ORDER — KETOROLAC TROMETHAMINE 30 MG/ML IJ SOLN: 30.0000 mg | Freq: Four times a day (QID) | INTRAMUSCULAR | Status: DC

## 2016-05-21 MED ORDER — LACTATED RINGERS IV SOLN
INTRAVENOUS | Status: DC
  Administered 2016-05-21 – 2016-05-22 (×2): via INTRAVENOUS

## 2016-05-21 MED ORDER — OXYTOCIN 40 UNITS IN LACTATED RINGERS INFUSION - SIMPLE MED
2.5000 [IU]/h | INTRAVENOUS | Status: AC
  Filled 2016-05-21: qty 1000

## 2016-05-21 MED ORDER — LACTATED RINGERS IV SOLN
INTRAVENOUS | Status: DC
  Administered 2016-05-21 (×4): via INTRAVENOUS

## 2016-05-21 MED ORDER — OXYTOCIN 40 UNITS IN LACTATED RINGERS INFUSION - SIMPLE MED
INTRAVENOUS | Status: DC | PRN
  Administered 2016-05-21: 1000 mL via INTRAVENOUS

## 2016-05-21 MED ORDER — TETANUS-DIPHTH-ACELL PERTUSSIS 5-2.5-18.5 LF-MCG/0.5 IM SUSP
0.5000 mL | Freq: Once | INTRAMUSCULAR | Status: DC
  Filled 2016-05-21: qty 0.5

## 2016-05-21 MED ORDER — SIMETHICONE 80 MG PO CHEW
80.0000 mg | CHEWABLE_TABLET | ORAL | Status: DC
  Administered 2016-05-22 – 2016-05-23 (×2): 80 mg via ORAL
  Filled 2016-05-21 (×2): qty 1

## 2016-05-21 MED ORDER — ONDANSETRON HCL 4 MG/2ML IJ SOLN
INTRAMUSCULAR | Status: DC | PRN
  Administered 2016-05-21: 4 mg via INTRAVENOUS

## 2016-05-21 MED ORDER — DEXTROSE 5 % IV SOLN
3.0000 g | Freq: Once | INTRAVENOUS | Status: AC
  Administered 2016-05-21 (×2): 3 g via INTRAVENOUS
  Filled 2016-05-21: qty 3000

## 2016-05-21 MED ORDER — LIDOCAINE 5 % EX PTCH
MEDICATED_PATCH | CUTANEOUS | Status: AC
  Filled 2016-05-21: qty 1

## 2016-05-21 SURGICAL SUPPLY — 25 items
BAG COUNTER SPONGE EZ (MISCELLANEOUS) ×3 IMPLANT
CANISTER SUCT 3000ML (MISCELLANEOUS) ×4 IMPLANT
CHLORAPREP W/TINT 26ML (MISCELLANEOUS) ×8 IMPLANT
COUNTER SPONGE BAG EZ (MISCELLANEOUS) ×1
DERMABOND ADVANCED (GAUZE/BANDAGES/DRESSINGS) ×2
DERMABOND ADVANCED .7 DNX12 (GAUZE/BANDAGES/DRESSINGS) ×2 IMPLANT
DRSG TELFA 3X8 NADH (GAUZE/BANDAGES/DRESSINGS) ×4 IMPLANT
ELECT REM PT RETURN 9FT ADLT (ELECTROSURGICAL) ×4
ELECTRODE REM PT RTRN 9FT ADLT (ELECTROSURGICAL) ×2 IMPLANT
GAUZE SPONGE 4X4 12PLY STRL (GAUZE/BANDAGES/DRESSINGS) ×4 IMPLANT
GLOVE BIO SURGEON STRL SZ 6.5 (GLOVE) ×3 IMPLANT
GLOVE BIO SURGEONS STRL SZ 6.5 (GLOVE) ×1
GLOVE INDICATOR 7.0 STRL GRN (GLOVE) ×4 IMPLANT
GOWN STRL REUS W/ TWL LRG LVL3 (GOWN DISPOSABLE) ×4 IMPLANT
GOWN STRL REUS W/TWL LRG LVL3 (GOWN DISPOSABLE) ×4
KIT RM TURNOVER STRD PROC AR (KITS) ×4 IMPLANT
NS IRRIG 1000ML POUR BTL (IV SOLUTION) ×4 IMPLANT
PACK C SECTION AR (MISCELLANEOUS) ×4 IMPLANT
PAD OB MATERNITY 4.3X12.25 (PERSONAL CARE ITEMS) ×4 IMPLANT
PAD PREP 24X41 OB/GYN DISP (PERSONAL CARE ITEMS) ×4 IMPLANT
SUT MNCRL AB 4-0 PS2 18 (SUTURE) ×4 IMPLANT
SUT PLAIN 2 0 XLH (SUTURE) IMPLANT
SUT VIC AB 0 CT1 36 (SUTURE) ×16 IMPLANT
SUT VIC AB 3-0 SH 27 (SUTURE) ×2
SUT VIC AB 3-0 SH 27X BRD (SUTURE) ×2 IMPLANT

## 2016-05-21 NOTE — Progress Notes (Signed)
Anna Gaines is a 35 y.o. G2P0101 at 8750w3d by LMP admitted for active labor  Subjective: Rates pain with contractions a 8 on pain scale 0-10, desires epidural  Objective: BP 118/71 (BP Location: Left Arm)   Pulse (!) 106   Temp 98 F (36.7 C) (Oral)   Resp 17   Ht 5\' 4"  (1.626 m)   Wt 244 lb (110.7 kg)   LMP 08/19/2015   BMI 41.88 kg/m  No intake/output data recorded. No intake/output data recorded.  FHT:  FHR: 155 bpm, variability: moderate,  accelerations:  Present,  decelerations:  Present early variables lasting 40-100seconds, with no response to position changes or IVF bolus,  UC:   irregular, every 4-6 minutes, moderate to palpation, uterus soft and nontender between contractions SVE:   Dilation: 7.5 Effacement (%): 100 Station: -2 Exam by:: De BlanchA. Evans, RN  Labs: Lab Results  Component Value Date   WBC 10.1 05/21/2016   HGB 10.7 (L) 05/21/2016   HCT 30.3 (L) 05/21/2016   MCV 89.1 05/21/2016   PLT 200 05/21/2016    Assessment / Plan: Spontaneous labor, progressing normally  Labor: Progressing normally Preeclampsia:  labs stable Fetal Wellbeing:  Category II Pain Control:  Labor support without medications I/D:  n/a Anticipated MOD:  NSVD Dr Valentino Saxonherry updated on FHR tracing and rapid dilation, will continue expectant management in hopes of vaginal delivery. Melody NIKE Shambley, CNM 05/21/2016, 9:09 AM

## 2016-05-21 NOTE — Progress Notes (Signed)
Fundus at navel  Moderate vaginal bleeding

## 2016-05-21 NOTE — Progress Notes (Signed)
Called to assess patient due to recurrent variable decelerations.  Patient was noted to have rapid fetal descent and cervical change (from 4/80/-2 to 7.5/100/0).  Patient was started on another amnioinfusion and has been on hands and knees with only modest improvement of recurrent decelerations.  Discussion had with patient regarding fetal distress, and would recommend proceeding to C-section at this time.  The risks of cesarean section discussed with the patient included but were not limited to: bleeding which may require transfusion or reoperation; infection which may require antibiotics; injury to bowel, bladder, ureters or other surrounding organs; injury to the fetus; need for additional procedures including hysterectomy in the event of a life-threatening hemorrhage; placental abnormalities wth subsequent pregnancies, incisional problems, thromboembolic phenomenon and other postoperative/anesthesia complications. The patient concurred with the proposed plan, giving informed written consent for the procedure.   Patient  will be NPO for procedure. Anesthesia and OR aware. Preoperative prophylactic antibiotics and SCDs ordered on call to the OR.  To OR when ready.   Hildred LaserAnika Ahron Hulbert, MD [facility]

## 2016-05-21 NOTE — Progress Notes (Signed)
nubain given for itching all over

## 2016-05-21 NOTE — Progress Notes (Signed)
Rn spoke with anesthesia Dr Rocco SerenePisctello  today and told him patient had already had tylenol and tordol and was still in pain  and RN verified that oxycodone was fine to give with spinal with narcs and he did not want to give an order for iv meds.

## 2016-05-21 NOTE — Progress Notes (Signed)
RN remains at the bedside, pt currently on knees hanging over the top of the bed, , pt. Prefers this position, lots of lower back pain, heat applied to lower back, [facility] and support.  FHR 120s and 130s, with early variables down to the 60s - FHR return to baseline with little  intervention (position change). Melody remains in the department watching over fetal monitoring and also at the bedside.  Dr. Valentino Saxonherry aware. Dr. Valentino Saxonherry currently on unit - has scheduled C-section.

## 2016-05-21 NOTE — Transfer of Care (Signed)
Immediate Anesthesia Transfer of Care Note  Patient: Anna Gaines  Procedure(s) Performed: Procedure(s): CESAREAN SECTION, EXCISON LEFT OVARIAN MASS (N/A) BILATERAL TUBAL LIGATION  Patient Location: PACU  Anesthesia Type:Spinal  Level of Consciousness: awake and alert   Airway & Oxygen Therapy: Patient Spontanous Breathing  Post-op Assessment: Report given to RN and Post -op Vital signs reviewed and stable  Post vital signs: Reviewed and stable  Last Vitals:  Vitals:   05/21/16 0716 05/21/16 1255  BP:  (!) 151/118  Pulse:  99  Resp: 17   Temp: 36.7 C (!) 36 C    Last Pain:  Vitals:   05/21/16 1033  TempSrc:   PainSc: 8          Complications: No apparent anesthesia complications

## 2016-05-21 NOTE — Anesthesia Preprocedure Evaluation (Signed)
Anesthesia Evaluation  Patient identified by MRN, date of birth, ID band Patient awake    Reviewed: Allergy & Precautions, NPO status , Patient's Chart, lab work & pertinent test results  History of Anesthesia Complications Negative for: history of anesthetic complications  Airway Mallampati: II  TM Distance: >3 FB Neck ROM: Full    Dental no notable dental hx.    Pulmonary neg pulmonary ROS, neg sleep apnea, neg COPD,    breath sounds clear to auscultation- rhonchi (-) wheezing      Cardiovascular Exercise Tolerance: Good (-) hypertension(-) CAD and (-) Past MI  Rhythm:Regular Rate:Normal - Systolic murmurs and - Diastolic murmurs    Neuro/Psych  Headaches, negative psych ROS   GI/Hepatic negative GI ROS, Neg liver ROS,   Endo/Other  negative endocrine ROSneg diabetes  Renal/GU negative Renal ROS     Musculoskeletal negative musculoskeletal ROS (+)   Abdominal (+) + obese,   Peds  Hematology negative hematology ROS (+)   Anesthesia Other Findings   Reproductive/Obstetrics (+) Pregnancy                             Anesthesia Physical Anesthesia Plan  ASA: II  Anesthesia Plan: Epidural   Post-op Pain Management:    Induction:   Airway Management Planned:   Additional Equipment:   Intra-op Plan:   Post-operative Plan:   Informed Consent: I have reviewed the patients History and Physical, chart, labs and discussed the procedure including the risks, benefits and alternatives for the proposed anesthesia with the patient or authorized representative who has indicated his/her understanding and acceptance.     Plan Discussed with: CRNA and Anesthesiologist  Anesthesia Plan Comments:         Lab Results  Component Value Date   WBC 10.1 05/21/2016   HGB 10.7 (L) 05/21/2016   HCT 30.3 (L) 05/21/2016   MCV 89.1 05/21/2016   PLT 200 05/21/2016    Anesthesia Quick  Evaluation

## 2016-05-21 NOTE — OB Triage Note (Signed)
Patient came in for observation for labor evaluation. Patient reports contractions every four to five minutes. Patient reports pain 6 out of 10. Patient denies leaking of fluid, vaginal bleeding and spotting. Patient state she has been feeling the baby move fine and no other complaints at this time. Vital signs stable and patient afebrile. FHR baseline 135 with moderate variability with accelerations 15 x 15 and no decelerations. Husband and son at bedside. Will continue to monitor.

## 2016-05-21 NOTE — Progress Notes (Signed)
Fundus at navel area   Moderate vaginal bleeding
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
## 2016-05-21 NOTE — Op Note (Signed)
Cesarean Section Procedure Note  Indications: non-reassuring fetal status  Pre-operative Diagnosis: 39 week 3 day pregnancy, non-reassuring fetal tracing, morbid obesity (BMI 41), family history of cancer, desires permanent sterilization  Post-operative Diagnosis: Same  Surgeon: Hildred LaserAnika Shamon Cothran, MD  Assistants: Harlow MaresMelody Shambley. CNM  Procedure: Primary low transverse Cesarean Section with bilateral salpingectomy  Anesthesia: Spinal anesthesia  Procedure Details: The patient was seen in the Holding Room. The risks, benefits, complications, treatment options, and expected outcomes were discussed with the patient.  The patient concurred with the proposed plan, giving informed consent.  The site of surgery properly noted/marked. The patient was taken to the Operating Room, identified as Anna Gaines and the procedure verified as C-Section Delivery with bilateral tubal ligation.   After induction of anesthesia, the patient was draped and prepped in the usual sterile manner. Anesthesia was tested and noted to be adequate. A Time Out was held and the above information confirmed.  A Pfannenstiel incision was made and carried down through the subcutaneous tissue to the fascia. Fascial incision was made and extended transversely. The fascia was separated from the underlying rectus tissue superiorly and inferiorly. The peritoneum was identified and entered. Peritoneal incision was extended longitudinally. The utero-vesical peritoneal reflection was incised transversely and the bladder flap was bluntly freed from the lower uterine segment. A low transverse uterine incision was made. Delivered from cephalic presentation was a 2730 gram Female with Apgar scores of 8 at one minute and 9 at five minutes. After the umbilical cord was clamped and cut cord blood was obtained for evaluation. The placenta was removed intact and appeared normal. The uterus was exteriorized and cleared of all clots and debris. The uterine  outline, tubes and ovaries appeared normal.  The uterine incision was closed with running locked sutures of 0-Vicryl.  A second suture of 0-Vicryl was used in an imbricating layer. Several figure of eight sutures were placed along the right lateral edge of the incision to achieve hemostasis.  Hemostasis was observed.  Attention was then turned to the fallopian tubes, and where the patient's right fallopian tube was identified and grasped with a Babcock clamp.   A Heaney clamp was then use to clamp a large segment of the fallopian tube, including the fimbriated end.  The segment was cut, and a Heaney stitch was placed using a 0-Vicryl.   The left fallopian tube was then ligated in a similar fashion and excised.   Good hemostasis was noted with bilateral fallopian tubes.  Examination of the ovaries noted filmy adhesions and peritoneal reaction of the right fallopian tube. The left fallopian tube was noted to have 2 small fleshy pink growths protruding from the capsule (with appearance of a hemangioma).  A biopsy of 1 of the sites was taken using the bovie.  The biopsy site was cauterized with good hemostasis.  The specimen was sent fresh. The uterus was then returned to the abdomen.    Lavage was carried out until clear. The fascia was then reapproximated with a running suture of 0-Vicryl. The subcutaneous fat layer was reapproximated with 3-0 Vicryl.  The skin was reapproximated with 4-0 Monocryl. Dermabond was placed over the incision.   Instrument, sponge, and needle counts were correct prior the abdominal closure and at the conclusion of the case.   Findings: Female infant, cephalic presentation, 2730 grams, with Apgar scores of 8 at one minute and 9 at five minutes. Intact placenta with 3 vessel cord.  The uterine outline, tubes and ovaries appeared normal.  Estimated Blood Loss:  400 ml      Drains: foley catheter to gravity drainage, 250 clear urine at end of the procedure         Total IV  Fluids:  1000 ml  Specimens: Placenta, segments of bilateral fallopian tubes, and left ovarian mass. Disposition:  Sent to Pathology         Implants: None         Complications:  None; patient tolerated the procedure well.         Disposition: PACU - hemodynamically stable.         Condition: stable   Hildred LaserAnika Delilah Mulgrew, MD [facility]

## 2016-05-21 NOTE — Progress Notes (Signed)
Anna Gaines is a 35 y.o. G2P0101 at 2353w3d by LMP admitted for active labor  Subjective: Breathing well through contractions  Objective: BP 118/71 (BP Location: Left Arm)   Pulse (!) 106   Temp 98 F (36.7 C) (Oral)   Resp 17   Ht 5\' 4"  (1.626 m)   Wt 244 lb (110.7 kg)   LMP 08/19/2015   BMI 41.88 kg/m  No intake/output data recorded. No intake/output data recorded.  Fetal Wellbeing:  Category II, moderate variability, early decels lasting 30-60 seconds. UC:   irregular, every 3-5 minutes, moderate to palpation SVE:   Dilation: 8 Effacement (%): 100 Station: 0 Exam by:: Melody, CNM  Labs: Lab Results  Component Value Date   WBC 10.1 05/21/2016   HGB 10.7 (L) 05/21/2016   HCT 30.3 (L) 05/21/2016   MCV 89.1 05/21/2016   PLT 200 05/21/2016    Assessment / Plan: Spontaneous labor, progressing normally, IUPC replaced as it came out, amnioinfusion started with LR at 150cc after 400cc bolus. Dr Valentino Saxonherry on unit and has reviewed strip, agrees with plan of care. Epidural not appropriate at this time  Labor: Progressing normally Preeclampsia:  labs stable Pain Control:  Labor support without medications Anticipated MOD:  NSVD  Melody N Shambley 05/21/2016, 9:25 AM

## 2016-05-21 NOTE — Anesthesia Procedure Notes (Addendum)
Spinal  Start time: 05/21/2016 11:05 AM End time: 05/21/2016 11:11 AM Staffing Anesthesiologist: Priscella MannPENWARDEN, AMY Resident/CRNA: Karoline CaldwellSTARR, Allah Reason Performed: resident/CRNA  Preanesthetic Checklist Completed: patient identified, site marked, surgical consent, pre-op evaluation, timeout performed, IV checked, risks and benefits discussed and monitors and equipment checked Spinal Block Patient position: sitting Prep: ChloraPrep Patient monitoring: heart rate and continuous pulse ox Approach: midline Location: L3-4 Injection technique: single-shot Needle Needle type: Pencil-Tip  Needle gauge: 24 G Needle length: 9 cm Assessment Sensory level: T6 Additional Notes Patient tolerated procedure well

## 2016-05-22 LAB — TYPE AND SCREEN
ABO/RH(D): A NEG
ANTIBODY SCREEN: POSITIVE
Unit division: 0
Unit division: 0

## 2016-05-22 LAB — RPR: RPR Ser Ql: NONREACTIVE

## 2016-05-22 LAB — CBC
HCT: 28.2 % — ABNORMAL LOW (ref 35.0–47.0)
Hemoglobin: 9.7 g/dL — ABNORMAL LOW (ref 12.0–16.0)
MCH: 31.2 pg (ref 26.0–34.0)
MCHC: 34.4 g/dL (ref 32.0–36.0)
MCV: 90.8 fL (ref 80.0–100.0)
PLATELETS: 169 10*3/uL (ref 150–440)
RBC: 3.11 MIL/uL — ABNORMAL LOW (ref 3.80–5.20)
RDW: 13.8 % (ref 11.5–14.5)
WBC: 10.4 10*3/uL (ref 3.6–11.0)

## 2016-05-22 LAB — FETAL SCREEN: FETAL SCREEN: NEGATIVE

## 2016-05-22 LAB — SURGICAL PATHOLOGY

## 2016-05-22 MED ORDER — LACTATED RINGERS IV BOLUS (SEPSIS)
500.0000 mL | Freq: Once | INTRAVENOUS | Status: AC
  Administered 2016-05-22: 500 mL via INTRAVENOUS

## 2016-05-22 MED ORDER — RHO D IMMUNE GLOBULIN 1500 UNIT/2ML IJ SOSY
300.0000 ug | PREFILLED_SYRINGE | Freq: Once | INTRAMUSCULAR | Status: AC
  Administered 2016-05-22: 300 ug via INTRAVENOUS
  Filled 2016-05-22: qty 2

## 2016-05-22 MED ORDER — LIDOCAINE 5 % EX PTCH
1.0000 | MEDICATED_PATCH | CUTANEOUS | Status: DC
  Administered 2016-05-22 – 2016-05-23 (×2): 1 via TRANSDERMAL
  Filled 2016-05-22 (×2): qty 1

## 2016-05-22 NOTE — Anesthesia Postprocedure Evaluation (Signed)
Anesthesia Post Note  Patient: [name]  Procedure(s) Performed: Procedure(s) (LRB): CESAREAN SECTION, EXCISON LEFT OVARIAN MASS (N/A) BILATERAL TUBAL LIGATION  Patient location during evaluation: Mother Baby Anesthesia Type: Spinal Level of consciousness: awake, awake and alert and oriented Pain management: pain level controlled Vital Signs Assessment: post-procedure vital signs reviewed and stable Respiratory status: spontaneous breathing Cardiovascular status: blood pressure returned to baseline Postop Assessment: no headache, no backache, no signs of nausea or vomiting and adequate PO intake Anesthetic complications: no    Last Vitals:  Vitals:   05/21/16 2250 05/22/16 0321  BP: 102/64 121/65  Pulse: 79 90  Resp: 16 17  Temp: 36.9 C 36.8 C    Last Pain:  Vitals:   05/22/16 0645  TempSrc:   PainSc: 6                  Levie Wages Lawerance CruelStarr

## 2016-05-22 NOTE — Progress Notes (Signed)
Subjective: Post Op Day 1 Day Post-Op: Cesarean Delivery Patient reports incisional pain, tolerating PO, no problems voiding, ambulating and Breastfeeding.    Objective: Vital signs in last 24 hours: Temp:  [96.8 F (36 C)-98.6 F (37 C)] 97.7 F (36.5 C) (12/07 0758) Pulse Rate:  [65-101] 77 (12/07 0758) Resp:  [16-20] 18 (12/07 0758) BP: (102-199)/(63-184) 117/72 (12/07 0758) SpO2:  [96 %-100 %] 96 % (12/07 0758)  Physical Exam:  General: alert, cooperative, appears stated age and mildly obese Lochia: appropriate Uterine Fundus: firm Incision: healing well, no significant drainage DVT Evaluation: No evidence of DVT seen on physical exam. Negative Homan's sign.   Recent Labs  05/21/16 0608 05/22/16 0544  HGB 10.7* 9.7*  HCT 30.3* 28.2*     Assessment/Plan: Status post Cesarean section. Doing well postoperatively.  Continue current care.  Melody N Shambley 05/22/2016, 8:39 AM

## 2016-05-22 NOTE — Anesthesia Post-op Follow-up Note (Signed)
  Anesthesia Pain Follow-up Note  Patient: Anna Gaines  Day #: 1  Date of Follow-up: 05/22/2016 Time: 7:33 AM  Last Vitals:  Vitals:   05/21/16 2250 05/22/16 0321  BP: 102/64 121/65  Pulse: 79 90  Resp: 16 17  Temp: 36.9 C 36.8 C    Level of Consciousness: alert  Pain: none   Side Effects:None  Catheter Site Exam:clean, dry, no drainage     Plan: D/C from anesthesia care at surgeon's request  Karoline Caldwelleana Christophe Rising

## 2016-05-22 NOTE — Progress Notes (Signed)
Postpartum Day # **1*: Cesarean Delivery  Subjective: Patient reports tolerating PO.  Has not ambulated or voided yet.   Objective: Vital signs in last 24 hours: Temp:  [96.8 F (36 C)-98.6 F (37 C)] 97.7 F (36.5 C) (12/07 0758) Pulse Rate:  [65-101] 77 (12/07 0758) Resp:  [16-20] 18 (12/07 0758) BP: (102-199)/(63-184) 117/72 (12/07 0758) SpO2:  [96 %-100 %] 96 % (12/07 0758)  Physical Exam:  General: alert and no distress Lungs: clear to auscultation bilaterally Breasts: normal appearance, no masses or tenderness Heart: regular rate and rhythm, S1, S2 normal, no murmur, click, rub or gallop Pelvis: Lochia appropriate, Uterine Fundus firm, Incision: healing well, no significant drainage, no dehiscence, no significant erythema Extremities: DVT Evaluation: Negative Homan's sign. No cords or calf tenderness. No significant calf/ankle edema.   Recent Labs  05/21/16 0608 05/22/16 0544  HGB 10.7* 9.7*  HCT 30.3* 28.2*    Assessment/Plan: Status post Cesarean section. Doing well postoperatively.  Breastfeeding and Contraception BTL Advance diet Continue PO pain management Encourage ambulation.  Discontinue IVF Continue current care. Discussed intraoperative findings (ovarian mass).   Hildred LaserAnika Gershom Brobeck [facility]

## 2016-05-23 LAB — RHOGAM INJECTION: UNIT DIVISION: 0

## 2016-05-23 MED ORDER — IBUPROFEN 800 MG PO TABS: 800.0000 mg | ORAL_TABLET | Freq: Four times a day (QID) | ORAL | 0 refills | Status: DC

## 2016-05-23 MED ORDER — OXYCODONE-ACETAMINOPHEN 5-325 MG PO TABS: 2.0000 | ORAL_TABLET | ORAL | 0 refills | Status: DC | PRN

## 2016-05-23 NOTE — Discharge Summary (Signed)
OB Discharge Summary     Patient Name: Anna Gaines DOB: 09/11/1980 MRN: [id number]  Date of admission: 05/21/2016 Delivering MD: Hildred LaserHERRY, ANIKA   Date of discharge: 05/23/2016  Admitting diagnosis: 39 wks preg contractions leaking fluid  na Intrauterine pregnancy: 7550w3d     Secondary diagnosis:  Active Problems:   Labor and delivery, indication for care  Additional problems: fetal intolerance to labor     Discharge diagnosis: Term Pregnancy Delivered and LTCS with BTL                                                                                                Post partum procedures:rhogam  Augmentation: none  Complications: None  Hospital course:  Onset of Labor With Unplanned C/S  35 y.o. yo G2P1101 at 10050w3d was admitted in Active Labor on 05/21/2016. Patient had a labor course significant for fetal intolerance. Membrane Rupture Time/Date: 8:15 AM ,05/21/2016   The patient went for cesarean section due to Non-Reassuring FHR, and delivered a Viable infant,05/21/2016  Details of operation can be found in separate operative note. Patient had an uncomplicated postpartum course.  She is ambulating,tolerating a regular diet, passing flatus, and urinating well.  Patient is discharged home in stable condition 05/23/16.   Physical exam Vitals:   05/22/16 1939 05/22/16 2312 05/23/16 0423 05/23/16 0733  BP: 128/82   121/69  Pulse: 91   85  Resp: 19   18  Temp: 98.2 F (36.8 C) 98.2 F (36.8 C) 98.1 F (36.7 C) 98.2 F (36.8 C)  TempSrc: Oral Oral Oral Oral  SpO2: 98%     Weight:      Height:       General: alert, cooperative and no distress Lochia: appropriate Uterine Fundus: firm Incision: Healing well with no significant drainage, No significant erythema DVT Evaluation: No evidence of DVT seen on physical exam. Negative Homan's sign. Labs: Lab Results  Component Value Date   WBC 10.4 05/22/2016   HGB 9.7 (L) 05/22/2016   HCT 28.2 (L) 05/22/2016   MCV 90.8 05/22/2016    PLT 169 05/22/2016   CMP Latest Ref Rng & Units 07/25/2015  Glucose 65 - 99 mg/dL 409(W100(H)  BUN 6 - 20 mg/dL 14  Creatinine 1.190.57 - 1.471.00 mg/dL 8.290.80  Sodium 562134 - 130144 mmol/L 142  Potassium 3.5 - 5.2 mmol/L 4.5  Chloride 96 - 106 mmol/L 105  Calcium 8.7 - 10.2 mg/dL 9.2  Total Protein 6.0 - 8.5 g/dL 6.4  Total Bilirubin 0.0 - 1.2 mg/dL 0.4  Alkaline Phos 39 - 117 IU/L 103  AST 0 - 40 IU/L 16  ALT 0 - 32 IU/L 16    Discharge instruction: per After Visit Summary and "Baby and Me Booklet".  After visit meds:    Medication List    TAKE these medications   FERRALET 90 90-1 MG Tabs Take 1 tablet by mouth daily.   ibuprofen 800 MG tablet Commonly known as:  ADVIL,MOTRIN Take 1 tablet (800 mg total) by mouth every 6 (six) hours.   oxyCODONE-acetaminophen 5-325 MG tablet Commonly known as:  PERCOCET/ROXICET Take  2 tablets by mouth every 4 (four) hours as needed (pain scale > 7).   pantoprazole 20 MG tablet Commonly known as:  PROTONIX Take 1 tablet (20 mg total) by mouth daily.   Prenatal Vitamins 28-0.8 MG Tabs Take 1 tablet by mouth daily.   valACYclovir 500 MG tablet Commonly known as:  VALTREX Take 1 tablet (500 mg total) by mouth 2 (two) times daily.       Diet: routine diet  Activity: Advance as tolerated. Pelvic rest for 6 weeks.   Outpatient follow up:6 weeks Follow up Appt:Future Appointments Date Time Provider Department Center  07/03/2016 8:30 AM Fani Rotondo Suzan NailerN Croy Drumwright, CNM [facility] None   Follow up Visit:No Follow-up on file.  Postpartum contraception: Tubal Ligation  Newborn Data: Live born female "Anna Gaines" Birth Weight: 6 lb 0.3 oz (2730 g) APGAR: 8, 9  Baby Feeding: Bottle Disposition:home with mother   05/23/2016 Purcell NailsMelody N Rainn Bullinger, CNM

## 2016-05-23 NOTE — Progress Notes (Signed)
Patient understands all discharge instructions and the need to make follow up appointments. Patient discharge via wheelchair with auxillary. 

## 2016-05-27 ENCOUNTER — Ambulatory Visit (INDEPENDENT_AMBULATORY_CARE_PROVIDER_SITE_OTHER): Payer: Managed Care, Other (non HMO) | Admitting: Obstetrics and Gynecology

## 2016-05-27 ENCOUNTER — Encounter: Payer: Self-pay | Admitting: Obstetrics and Gynecology

## 2016-05-27 VITALS — BP 125/82 | HR 88 | Ht 64.0 in | Wt 228.0 lb

## 2016-05-27 DIAGNOSIS — Z98891 History of uterine scar from previous surgery: Secondary | ICD-10-CM

## 2016-05-27 MED ORDER — VALACYCLOVIR HCL 500 MG PO TABS: 500.0000 mg | ORAL_TABLET | Freq: Two times a day (BID) | ORAL | 6 refills | Status: AC

## 2016-05-27 NOTE — Patient Instructions (Signed)
  Place gynecology postoperative visit patient instructions here.  

## 2016-05-27 NOTE — Progress Notes (Signed)
   Subjective:     Anna Gaines is a 35 y.o. female who presents to the clinic 1 weeks status post LTCS with bilateral BTL for fetal intolerance to labor. Eating a regular diet without difficulty. Bowel movements are abnormal with constipation. Pain is controlled with current analgesics. Medications being used: ibuprofen (OTC).  The following portions of the patient's history were reviewed and updated as appropriate: allergies, current medications, past family history, past medical history, past social history, past surgical history and problem list.  Review of Systems Pertinent items noted in HPI and remainder of comprehensive ROS otherwise negative.    Objective:    BP 125/82   Pulse 88   Ht 5\' 4"  (1.626 m)   Wt 228 lb (103.4 kg)   Breastfeeding? No   BMI 39.14 kg/m  General:  alert, cooperative and appears stated age  Abdomen: soft, bowel sounds active, non-tender  Incision:   healing well, no drainage, no erythema, no hernia, no seroma, no swelling, no dehiscence, incision well approximated     Assessment:    Doing well postoperatively. Operative findings again reviewed. Pathology report discussed.    Plan:    1. Continue any current medications. 2. Wound care discussed. 3. Activity restrictions: NA 4. Anticipated return to work: not applicable. 5. Follow up: 5 weeks for PPV.

## 2016-07-03 ENCOUNTER — Encounter: Payer: Managed Care, Other (non HMO) | Admitting: Obstetrics and Gynecology

## 2016-07-08 ENCOUNTER — Encounter: Payer: Self-pay | Admitting: Obstetrics and Gynecology

## 2016-07-08 ENCOUNTER — Ambulatory Visit (INDEPENDENT_AMBULATORY_CARE_PROVIDER_SITE_OTHER): Payer: Managed Care, Other (non HMO) | Admitting: Obstetrics and Gynecology

## 2016-07-08 NOTE — Addendum Note (Signed)
Addended by: Rosine BeatLONTZ, AMY L on: 07/08/2016 04:48 PM   Modules accepted: Orders

## 2016-07-08 NOTE — Patient Instructions (Signed)
  Place postpartum visit patient instructions here.  

## 2016-07-08 NOTE — Progress Notes (Signed)
   Subjective:     Anna Gaines is a 36 y.o. female who presents for a postpartum visit. She is 6 weeks postpartum following a low cervical transverse Cesarean section. I have fully reviewed the prenatal and intrapartum course. The delivery was at 39 gestational weeks. Outcome: primary cesarean section, low transverse incision. Anesthesia: epidural. Postpartum course has been uncomplicated. Baby's course has been uncomplicated. Baby is feeding by formula. Bleeding no bleeding. Bowel function is normal. Bladder function is normal. Patient is not sexually active. Contraception method is abstinence and tubal ligation. Postpartum depression screening: negative.  The following portions of the patient's history were reviewed and updated as appropriate: allergies, current medications, past family history, past medical history, past social history, past surgical history and problem list.  Review of Systems Pertinent items noted in HPI and remainder of comprehensive ROS otherwise negative.   Objective:    BP 116/73   Pulse 83   Ht 5\' 4"  (1.626 m)   Wt 220 lb 14.4 oz (100.2 kg)   Breastfeeding? No   BMI 37.92 kg/m   General:  alert, cooperative, appears stated age and morbidly obese   Breasts:  not examined  Lungs: clear to auscultation bilaterally  Heart:  regular rate and rhythm, S1, S2 normal, no murmur, click, rub or gallop  Abdomen: soft, non-tender; bowel sounds normal; no masses,  no organomegaly   Vulva:  normal  Vagina: normal vagina, no discharge, exudate, lesion, or erythema  Cervix:  multiparous appearance  Corpus: normal size, contour, position, consistency, mobility, non-tender  Adnexa:  normal adnexa and no mass, fullness, tenderness  Rectal Exam: Not performed.        Assessment:     6 weeks postpartum exam. Pap smear not done at today's visit.   Plan:    1. Contraception: tubal ligation 2. Breast managment 3. Follow up in: 4 months for AE or as needed.

## 2016-07-09 ENCOUNTER — Other Ambulatory Visit: Payer: Self-pay | Admitting: *Deleted

## 2016-07-09 LAB — CBC
HEMOGLOBIN: 11.2 g/dL (ref 11.1–15.9)
Hematocrit: 34.6 % (ref 34.0–46.6)
MCH: 28.6 pg (ref 26.6–33.0)
MCHC: 32.4 g/dL (ref 31.5–35.7)
MCV: 89 fL (ref 79–97)
PLATELETS: 276 10*3/uL (ref 150–379)
RBC: 3.91 x10E6/uL (ref 3.77–5.28)
RDW: 14.1 % (ref 12.3–15.4)
WBC: 5.4 10*3/uL (ref 3.4–10.8)

## 2016-07-09 LAB — VITAMIN D 25 HYDROXY (VIT D DEFICIENCY, FRACTURES): VIT D 25 HYDROXY: 47 ng/mL (ref 30.0–100.0)

## 2016-07-09 LAB — IRON: IRON: 35 ug/dL (ref 27–159)

## 2016-07-09 MED ORDER — TRIAMCINOLONE 0.1 % CREAM:EUCERIN CREAM 1:1: 1.0000 "application " | TOPICAL_CREAM | Freq: Two times a day (BID) | CUTANEOUS | 6 refills | Status: DC

## 2016-07-10 ENCOUNTER — Encounter: Payer: Managed Care, Other (non HMO) | Admitting: Obstetrics and Gynecology

## 2016-07-28 ENCOUNTER — Encounter: Payer: Self-pay | Admitting: Obstetrics and Gynecology

## 2016-11-05 ENCOUNTER — Encounter: Payer: Managed Care, Other (non HMO) | Admitting: Obstetrics and Gynecology

## 2016-11-12 ENCOUNTER — Other Ambulatory Visit: Payer: Self-pay | Admitting: Obstetrics and Gynecology

## 2016-11-12 ENCOUNTER — Ambulatory Visit (INDEPENDENT_AMBULATORY_CARE_PROVIDER_SITE_OTHER): Payer: Medicaid Other | Admitting: Obstetrics and Gynecology

## 2016-11-12 ENCOUNTER — Encounter: Payer: Self-pay | Admitting: Obstetrics and Gynecology

## 2016-11-12 VITALS — BP 117/82 | HR 72 | Ht 64.0 in | Wt 208.6 lb

## 2016-11-12 DIAGNOSIS — Z01419 Encounter for gynecological examination (general) (routine) without abnormal findings: Secondary | ICD-10-CM

## 2016-11-12 DIAGNOSIS — Z0000 Encounter for general adult medical examination without abnormal findings: Secondary | ICD-10-CM
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
## 2016-11-12 NOTE — Patient Instructions (Signed)
 Preventive Care 18-39 Years, Female Preventive care refers to lifestyle choices and visits with your health care provider that can promote health and wellness. What does preventive care include?  A yearly physical exam. This is also called an annual well check.  Dental exams once or twice a year.  Routine eye exams. Ask your health care provider how often you should have your eyes checked.  Personal lifestyle choices, including:  Daily care of your teeth and gums.  Regular physical activity.  Eating a healthy diet.  Avoiding tobacco and drug use.  Limiting alcohol use.  Practicing safe sex.  Taking vitamin and mineral supplements as recommended by your health care provider. What happens during an annual well check? The services and screenings done by your health care provider during your annual well check will depend on your age, overall health, lifestyle risk factors, and family history of disease. Counseling  Your health care provider may ask you questions about your:  Alcohol use.  Tobacco use.  Drug use.  Emotional well-being.  Home and relationship well-being.  Sexual activity.  Eating habits.  Work and work environment.  Method of birth control.  Menstrual cycle.  Pregnancy history. Screening  You may have the following tests or measurements:  Height, weight, and BMI.  Diabetes screening. This is done by checking your blood sugar (glucose) after you have not eaten for a while (fasting).  Blood pressure.  Lipid and cholesterol levels. These may be checked every 5 years starting at age 20.  Skin check.  Hepatitis C blood test.  Hepatitis B blood test.  Sexually transmitted disease (STD) testing.  BRCA-related cancer screening. This may be done if you have a family history of breast, ovarian, tubal, or peritoneal cancers.  Pelvic exam and Pap test. This may be done every 3 years starting at age 21. Starting at age 30, this may be done  every 5 years if you have a Pap test in combination with an HPV test. Discuss your test results, treatment options, and if necessary, the need for more tests with your health care provider. Vaccines  Your health care provider may recommend certain vaccines, such as:  Influenza vaccine. This is recommended every year.  Tetanus, diphtheria, and acellular pertussis (Tdap, Td) vaccine. You may need a Td booster every 10 years.  Varicella vaccine. You may need this if you have not been vaccinated.  HPV vaccine. If you are 26 or younger, you may need three doses over 6 months.  Measles, mumps, and rubella (MMR) vaccine. You may need at least one dose of MMR. You may also need a second dose.  Pneumococcal 13-valent conjugate (PCV13) vaccine. You may need this if you have certain conditions and were not previously vaccinated.  Pneumococcal polysaccharide (PPSV23) vaccine. You may need one or two doses if you smoke cigarettes or if you have certain conditions.  Meningococcal vaccine. One dose is recommended if you are age 19-21 years and a first-year college student living in a residence hall, or if you have one of several medical conditions. You may also need additional booster doses.  Hepatitis A vaccine. You may need this if you have certain conditions or if you travel or work in places where you may be exposed to hepatitis A.  Hepatitis B vaccine. You may need this if you have certain conditions or if you travel or work in places where you may be exposed to hepatitis B.  Haemophilus influenzae type b (Hib) vaccine. You may need   this if you have certain risk factors. Talk to your health care provider about which screenings and vaccines you need and how often you need them. This information is not intended to replace advice given to you by your health care provider. Make sure you discuss any questions you have with your health care provider. Document Released: 07/29/2001 Document Revised:  02/20/2016 Document Reviewed: 04/03/2015 Elsevier Interactive Patient Education  2017 [employer]  

## 2016-11-13 LAB — COMPREHENSIVE METABOLIC PANEL
ALT: 16 IU/L (ref 0–32)
AST: 19 IU/L (ref 0–40)
Albumin/Globulin Ratio: 1.6 (ref 1.2–2.2)
Albumin: 4.1 g/dL (ref 3.5–5.5)
Alkaline Phosphatase: 132 IU/L — ABNORMAL HIGH (ref 39–117)
BILIRUBIN TOTAL: 0.4 mg/dL (ref 0.0–1.2)
BUN/Creatinine Ratio: 16 (ref 9–23)
BUN: 13 mg/dL (ref 6–20)
CHLORIDE: 102 mmol/L (ref 96–106)
CO2: 26 mmol/L (ref 18–29)
CREATININE: 0.8 mg/dL (ref 0.57–1.00)
Calcium: 9.3 mg/dL (ref 8.7–10.2)
GFR calc Af Amer: 110 mL/min/{1.73_m2} (ref >59)
GFR calc non Af Amer: 95 mL/min/{1.73_m2} (ref >59)
GLOBULIN, TOTAL: 2.6 g/dL (ref 1.5–4.5)
GLUCOSE: 84 mg/dL (ref 65–99)
Potassium: 4.1 mmol/L (ref 3.5–5.2)
SODIUM: 141 mmol/L (ref 134–144)
Total Protein: 6.7 g/dL (ref 6.0–8.5)

## 2016-11-13 LAB — HEMOGLOBIN A1C
ESTIMATED AVERAGE GLUCOSE: 105 mg/dL
HEMOGLOBIN A1C: 5.3 % (ref 4.8–5.6)

## 2016-11-13 LAB — LIPID PANEL
CHOLESTEROL TOTAL: 173 mg/dL (ref 100–199)
Chol/HDL Ratio: 3.5 ratio (ref 0.0–4.4)
HDL: 50 mg/dL (ref >39)
LDL Calculated: 110 mg/dL — ABNORMAL HIGH (ref 0–99)
Triglycerides: 65 mg/dL (ref 0–149)
VLDL CHOLESTEROL CAL: 13 mg/dL (ref 5–40)

## 2016-11-17 LAB — CYTOLOGY - PAP

## 2017-01-15 IMAGING — CT CT HEAD WITHOUT CONTRAST
1 of 2 series · 16 of 29 positions shown, 20 images · non-contrast
Comparison: None.

CLINICAL DATA: Worsening migraines with nausea and photophobia for
2 years.

EXAM:
CT HEAD WITHOUT CONTRAST
TECHNIQUE: Contiguous axial images were obtained from the base of the skull
through the vertex without intravenous contrast.

[Series 2: head wo · axial · 0.39mm/px · z∈[-51,+64]mm · 16 of 26 slices shown, 20 images]
[im 2/26  brain]
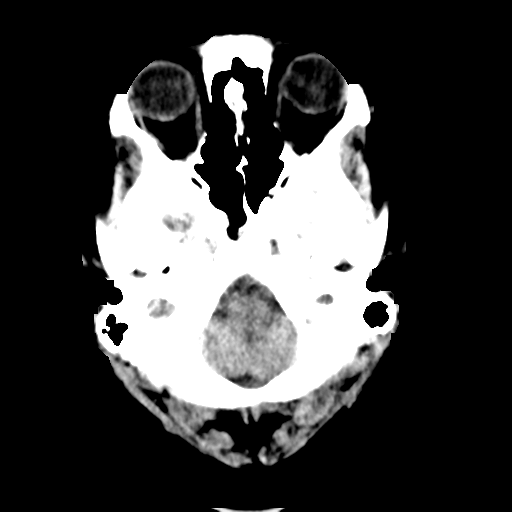
[im 2/26  bone]
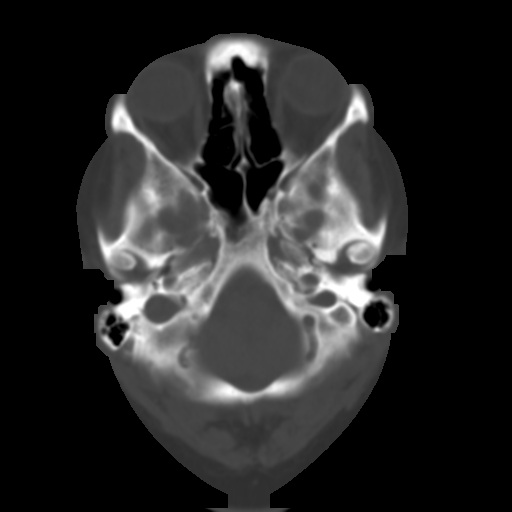
[im 4/26  brain]
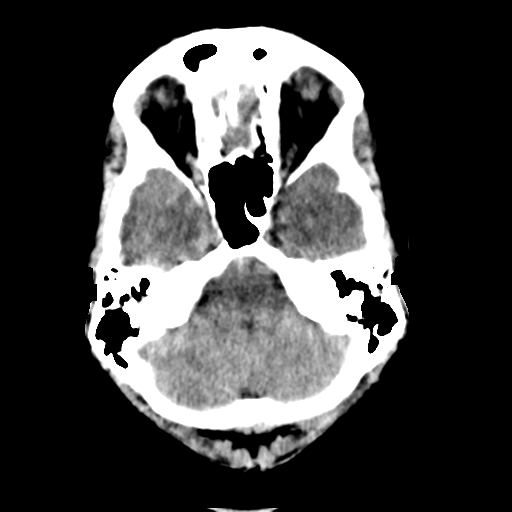
[im 5/26  brain]
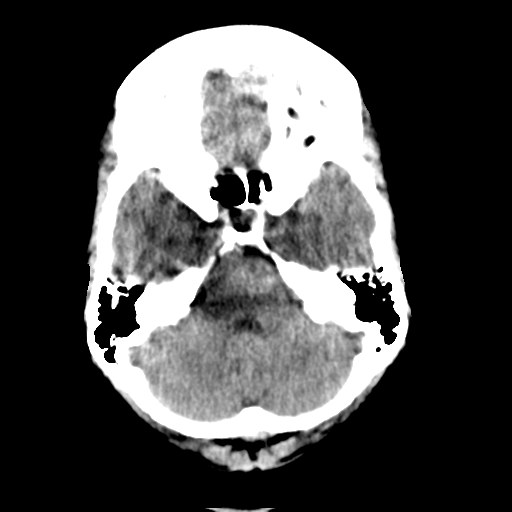
[im 7/26  brain]
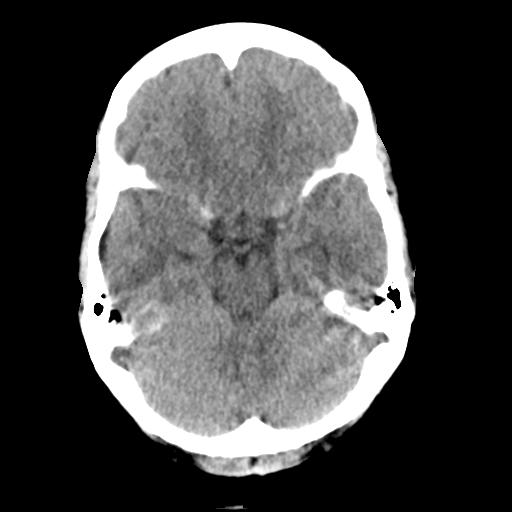
[im 8/26  brain]
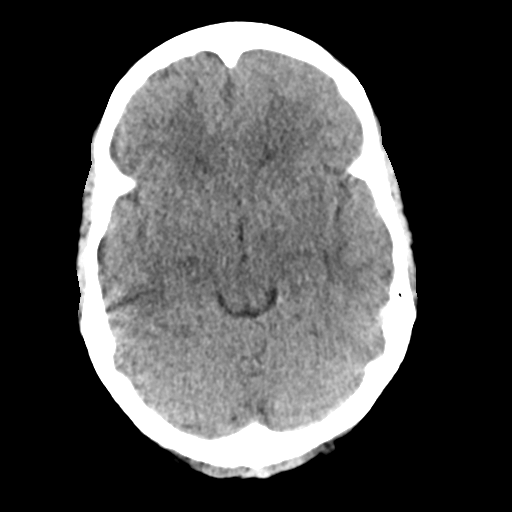
[im 8/26  bone]
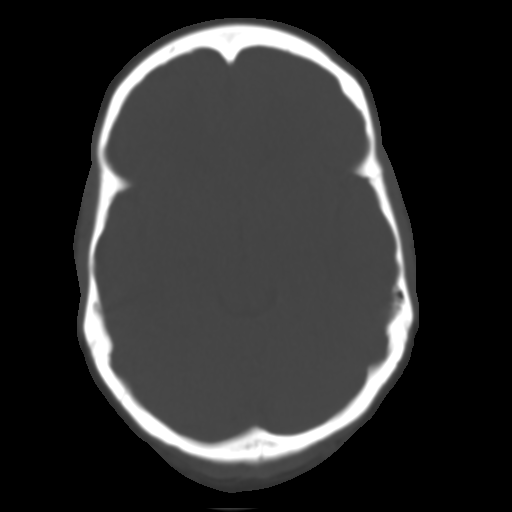
[im 10/26  brain]
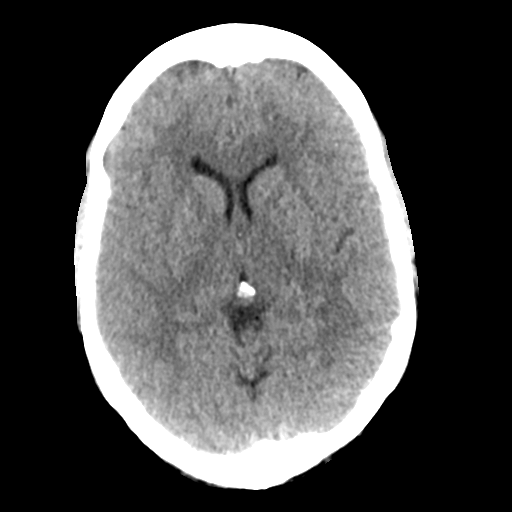
[im 11/26  brain]
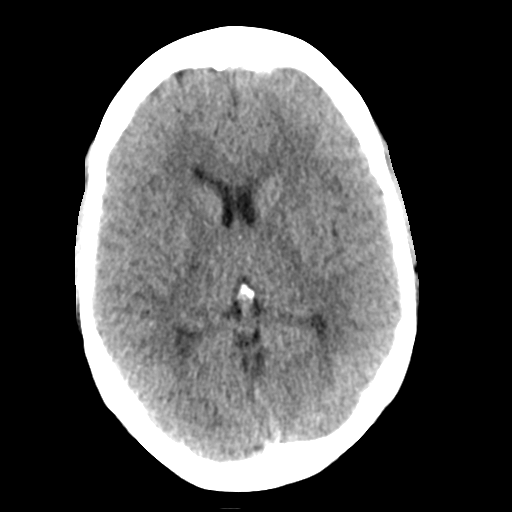
[im 13/26  brain]
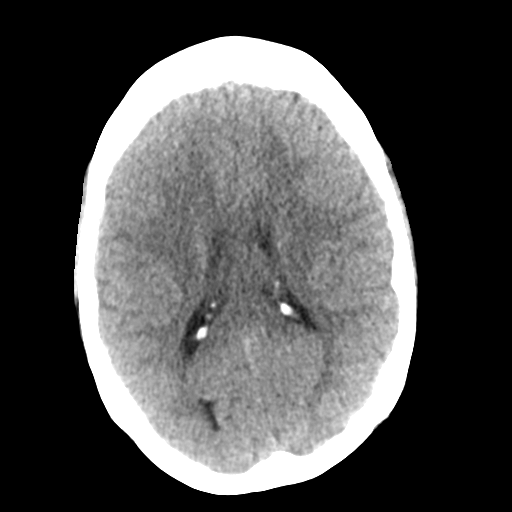
[im 14/26  brain]
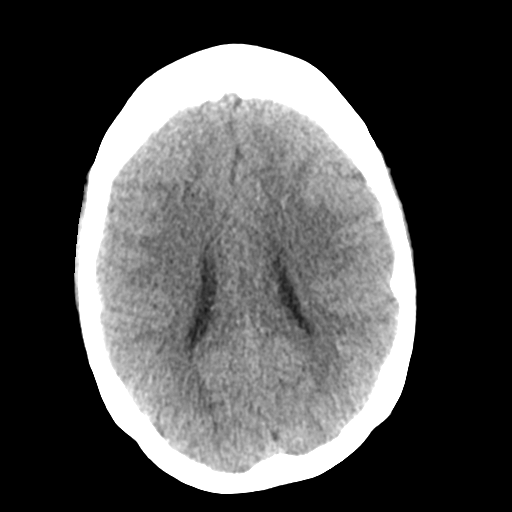
[im 14/26  bone]
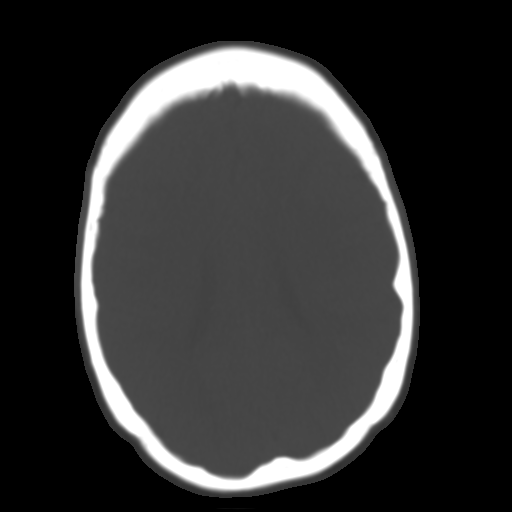
[im 16/26  brain]
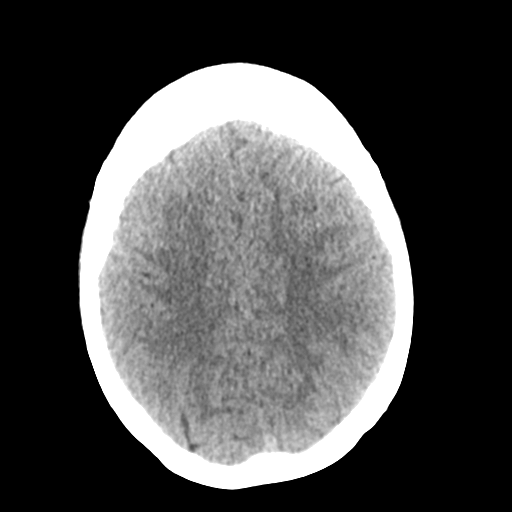
[im 17/26  brain]
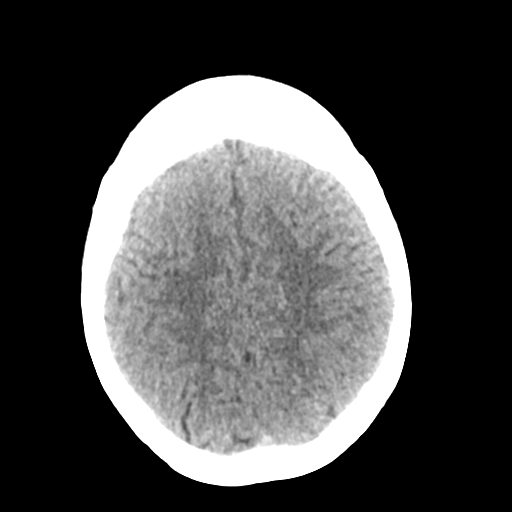
[im 19/26  brain]
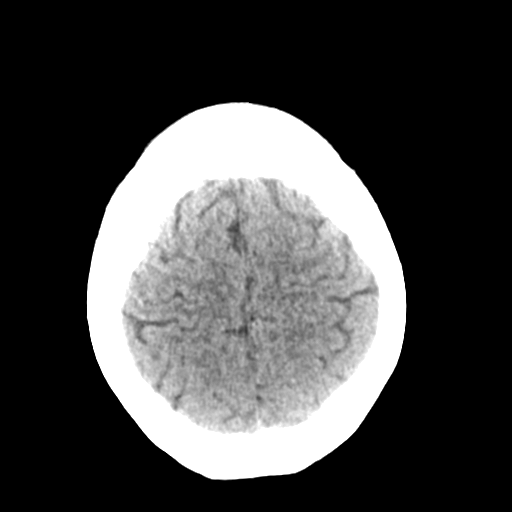
[im 20/26  brain]
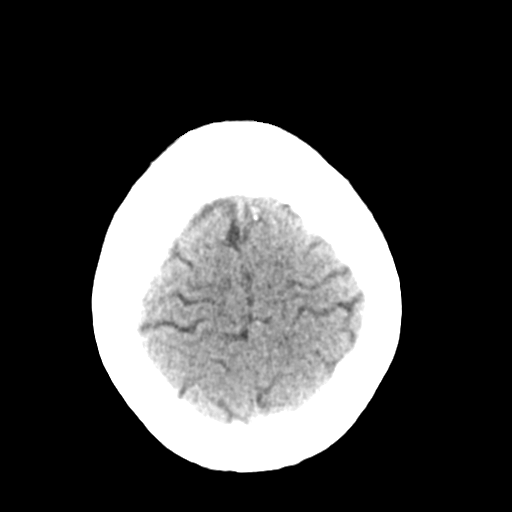
[im 20/26  bone]
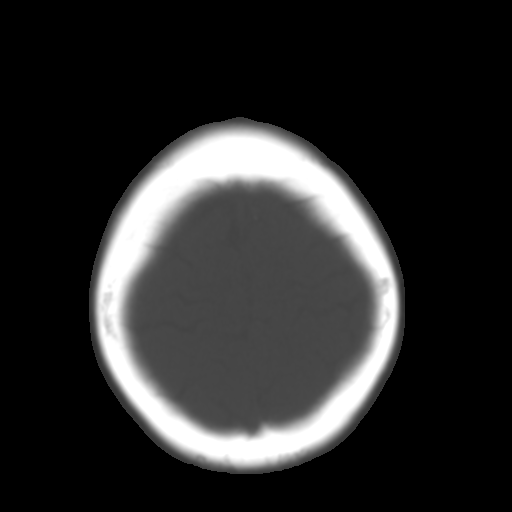
[im 22/26  brain]
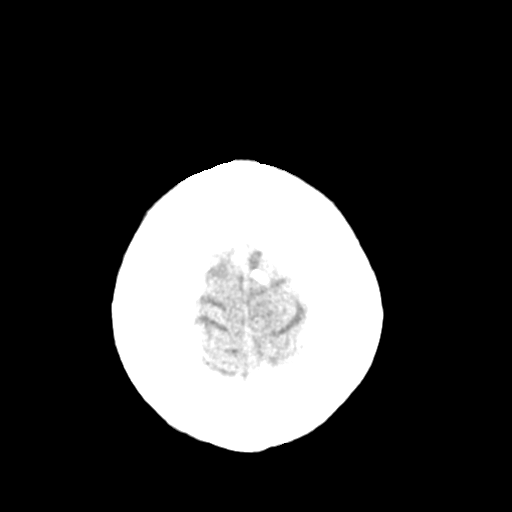
[im 23/26  brain]
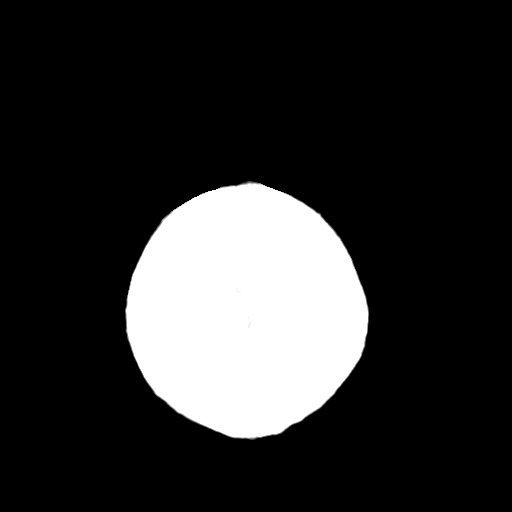
[im 25/26  brain]
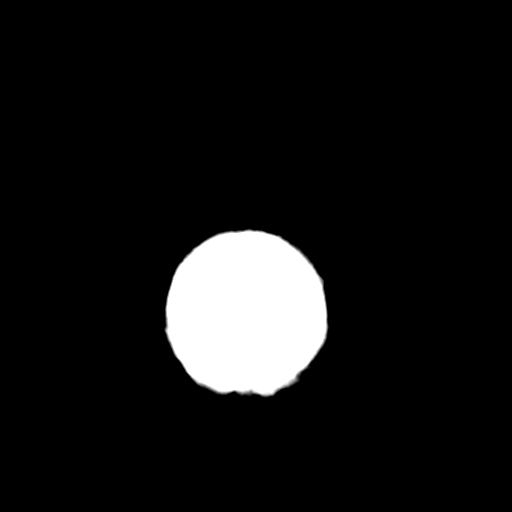

[16 of 29 positions shown; findings below may reference images not displayed]

FINDINGS: There is no evidence of mass effect, midline shift or extra-axial
fluid collections. There is no evidence of a space-occupying lesion
or intracranial hemorrhage. There is no evidence of a cortical-based
area of acute infarction.

The ventricles and sulci are appropriate for the patient's age. The
basal cisterns are patent.

Visualized portions of the orbits are unremarkable. The visualized
portions of the paranasal sinuses and mastoid air cells are
unremarkable.

The osseous structures are unremarkable.
IMPRESSION: Normal CT of the brain without intravenous contrast.

## 2017-11-18 ENCOUNTER — Encounter: Payer: Self-pay | Admitting: Obstetrics and Gynecology

## 2017-11-18 ENCOUNTER — Ambulatory Visit (INDEPENDENT_AMBULATORY_CARE_PROVIDER_SITE_OTHER): Payer: Medicaid Other | Admitting: Obstetrics and Gynecology

## 2017-11-18 ENCOUNTER — Other Ambulatory Visit: Payer: Self-pay | Admitting: *Deleted

## 2017-11-18 VITALS — BP 121/82 | HR 78 | Ht 64.0 in | Wt 209.1 lb

## 2017-11-18 DIAGNOSIS — Z803 Family history of malignant neoplasm of breast: Secondary | ICD-10-CM

## 2017-11-18 DIAGNOSIS — Z01419 Encounter for gynecological examination (general) (routine) without abnormal findings: Secondary | ICD-10-CM

## 2017-11-18 MED ORDER — ACYCLOVIR 400 MG PO TABS: 400.0000 mg | ORAL_TABLET | Freq: Two times a day (BID) | ORAL | 3 refills | Status: DC
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
## 2017-11-19 LAB — LIPID PANEL
CHOLESTEROL TOTAL: 158 mg/dL (ref 100–199)
Chol/HDL Ratio: 3.1 ratio (ref 0.0–4.4)
HDL: 51 mg/dL (ref >39)
LDL Calculated: 95 mg/dL (ref 0–99)
Triglycerides: 58 mg/dL (ref 0–149)
VLDL CHOLESTEROL CAL: 12 mg/dL (ref 5–40)

## 2017-11-19 LAB — COMPREHENSIVE METABOLIC PANEL
ALBUMIN: 3.9 g/dL (ref 3.5–5.5)
ALT: 8 IU/L (ref 0–32)
AST: 12 IU/L (ref 0–40)
Albumin/Globulin Ratio: 1.8 (ref 1.2–2.2)
Alkaline Phosphatase: 91 IU/L (ref 39–117)
BUN / CREAT RATIO: 16 (ref 9–23)
BUN: 12 mg/dL (ref 6–20)
Bilirubin Total: 0.3 mg/dL (ref 0.0–1.2)
CALCIUM: 9 mg/dL (ref 8.7–10.2)
CO2: 24 mmol/L (ref 20–29)
CREATININE: 0.73 mg/dL (ref 0.57–1.00)
Chloride: 107 mmol/L — ABNORMAL HIGH (ref 96–106)
GFR, EST AFRICAN AMERICAN: 121 mL/min/{1.73_m2} (ref >59)
GFR, EST NON AFRICAN AMERICAN: 105 mL/min/{1.73_m2} (ref >59)
GLOBULIN, TOTAL: 2.2 g/dL (ref 1.5–4.5)
Glucose: 82 mg/dL (ref 65–99)
Potassium: 3.9 mmol/L (ref 3.5–5.2)
SODIUM: 144 mmol/L (ref 134–144)
Total Protein: 6.1 g/dL (ref 6.0–8.5)

## 2017-11-19 LAB — HEMOGLOBIN A1C
Est. average glucose Bld gHb Est-mCnc: 103 mg/dL
Hgb A1c MFr Bld: 5.2 % (ref 4.8–5.6)

## 2018-02-14 ENCOUNTER — Other Ambulatory Visit: Payer: Self-pay | Admitting: Obstetrics and Gynecology

## 2018-11-25 ENCOUNTER — Encounter: Payer: Medicaid Other | Admitting: Obstetrics and Gynecology

## 2018-12-02 ENCOUNTER — Encounter: Payer: Medicaid Other | Admitting: Obstetrics and Gynecology
